# Patient Record
Sex: Male | Born: 1985 | Race: White | Hispanic: No | Marital: Single | Smoking: Current every day smoker
Health system: Southern US, Community
[De-identification: ages and names within clinical notes are randomized; demographics above are authoritative.]

## PROBLEM LIST (undated history)

## (undated) DIAGNOSIS — F32A Depression, unspecified: Secondary | ICD-10-CM

## (undated) DIAGNOSIS — F319 Bipolar disorder, unspecified: Secondary | ICD-10-CM

## (undated) DIAGNOSIS — G039 Meningitis, unspecified: Secondary | ICD-10-CM

## (undated) DIAGNOSIS — F209 Schizophrenia, unspecified: Secondary | ICD-10-CM

## (undated) DIAGNOSIS — F329 Major depressive disorder, single episode, unspecified: Secondary | ICD-10-CM

## (undated) DIAGNOSIS — F419 Anxiety disorder, unspecified: Secondary | ICD-10-CM

## (undated) DIAGNOSIS — F909 Attention-deficit hyperactivity disorder, unspecified type: Secondary | ICD-10-CM

---
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
## 2019-10-16 ENCOUNTER — Emergency Department (HOSPITAL_COMMUNITY): Payer: Self-pay

## 2019-10-16 ENCOUNTER — Inpatient Hospital Stay (HOSPITAL_COMMUNITY)
Admission: EM | Admit: 2019-10-16 | Discharge: 2019-10-20 | DRG: 580 | Disposition: A | Discharge status: Other Acute Inpatient Hospital | Payer: Self-pay | Attending: Surgery | Admitting: Surgery

## 2019-10-16 ENCOUNTER — Encounter (HOSPITAL_COMMUNITY): Payer: Self-pay | Admitting: Emergency Medicine

## 2019-10-16 DIAGNOSIS — F332 Major depressive disorder, recurrent severe without psychotic features: Secondary | ICD-10-CM

## 2019-10-16 DIAGNOSIS — S1191XA Laceration without foreign body of unspecified part of neck, initial encounter: Principal | ICD-10-CM | POA: Diagnosis present

## 2019-10-16 DIAGNOSIS — F1520 Other stimulant dependence, uncomplicated: Secondary | ICD-10-CM | POA: Diagnosis present

## 2019-10-16 DIAGNOSIS — F322 Major depressive disorder, single episode, severe without psychotic features: Secondary | ICD-10-CM | POA: Diagnosis present

## 2019-10-16 DIAGNOSIS — X789XXA Intentional self-harm by unspecified sharp object, initial encounter: Secondary | ICD-10-CM | POA: Diagnosis present

## 2019-10-16 DIAGNOSIS — F319 Bipolar disorder, unspecified: Secondary | ICD-10-CM | POA: Diagnosis present

## 2019-10-16 DIAGNOSIS — Z9114 Patient's other noncompliance with medication regimen: Secondary | ICD-10-CM

## 2019-10-16 DIAGNOSIS — Z9119 Patient's noncompliance with other medical treatment and regimen: Secondary | ICD-10-CM

## 2019-10-16 DIAGNOSIS — T1490XA Injury, unspecified, initial encounter: Secondary | ICD-10-CM

## 2019-10-16 DIAGNOSIS — Z23 Encounter for immunization: Secondary | ICD-10-CM

## 2019-10-16 DIAGNOSIS — R40242 Glasgow coma scale score 9-12, unspecified time: Secondary | ICD-10-CM | POA: Diagnosis present

## 2019-10-16 DIAGNOSIS — T1491XA Suicide attempt, initial encounter: Secondary | ICD-10-CM | POA: Diagnosis present

## 2019-10-16 DIAGNOSIS — S162XXA Laceration of muscle, fascia and tendon at neck level, initial encounter: Secondary | ICD-10-CM | POA: Diagnosis present

## 2019-10-16 DIAGNOSIS — R4182 Altered mental status, unspecified: Secondary | ICD-10-CM | POA: Diagnosis present

## 2019-10-16 DIAGNOSIS — Z818 Family history of other mental and behavioral disorders: Secondary | ICD-10-CM

## 2019-10-16 DIAGNOSIS — Z20822 Contact with and (suspected) exposure to covid-19: Secondary | ICD-10-CM | POA: Diagnosis present

## 2019-10-16 DIAGNOSIS — F1594 Other stimulant use, unspecified with stimulant-induced mood disorder: Secondary | ICD-10-CM

## 2019-10-16 DIAGNOSIS — N179 Acute kidney failure, unspecified: Secondary | ICD-10-CM | POA: Diagnosis present

## 2019-10-16 DIAGNOSIS — R61 Generalized hyperhidrosis: Secondary | ICD-10-CM | POA: Diagnosis present

## 2019-10-16 DIAGNOSIS — F209 Schizophrenia, unspecified: Secondary | ICD-10-CM | POA: Diagnosis present

## 2019-10-16 DIAGNOSIS — J9690 Respiratory failure, unspecified, unspecified whether with hypoxia or hypercapnia: Secondary | ICD-10-CM

## 2019-10-16 HISTORY — DX: Bipolar disorder, unspecified: F31.9

## 2019-10-16 HISTORY — DX: Schizophrenia, unspecified: F20.9

## 2019-10-16 LAB — URINALYSIS, ROUTINE W REFLEX MICROSCOPIC
Bilirubin Urine: NEGATIVE
Glucose, UA: 50 mg/dL — AB
Hgb urine dipstick: NEGATIVE
Ketones, ur: 20 mg/dL — AB
Leukocytes,Ua: NEGATIVE
Nitrite: NEGATIVE
Protein, ur: 30 mg/dL — AB
Specific Gravity, Urine: 1.032 — ABNORMAL HIGH (ref 1.005–1.030)
pH: 5 (ref 5.0–8.0)

## 2019-10-16 LAB — COMPREHENSIVE METABOLIC PANEL
ALT: 43 U/L (ref 0–44)
AST: 48 U/L — ABNORMAL HIGH (ref 15–41)
Albumin: 4.6 g/dL (ref 3.5–5.0)
Alkaline Phosphatase: 56 U/L (ref 38–126)
Anion gap: 23 — ABNORMAL HIGH (ref 5–15)
BUN: 20 mg/dL (ref 6–20)
CO2: 18 mmol/L — ABNORMAL LOW (ref 22–32)
Calcium: 9.6 mg/dL (ref 8.9–10.3)
Chloride: 98 mmol/L (ref 98–111)
Creatinine, Ser: 1.87 mg/dL — ABNORMAL HIGH (ref 0.61–1.24)
GFR calc Af Amer: 54 mL/min — ABNORMAL LOW (ref >60)
GFR calc non Af Amer: 47 mL/min — ABNORMAL LOW (ref >60)
Glucose, Bld: 250 mg/dL — ABNORMAL HIGH (ref 70–99)
Potassium: 3.2 mmol/L — ABNORMAL LOW (ref 3.5–5.1)
Sodium: 139 mmol/L (ref 135–145)
Total Bilirubin: 2.1 mg/dL — ABNORMAL HIGH (ref 0.3–1.2)
Total Protein: 7.2 g/dL (ref 6.5–8.1)

## 2019-10-16 LAB — CBC
HCT: 47.5 % (ref 39.0–52.0)
Hemoglobin: 15.8 g/dL (ref 13.0–17.0)
MCH: 29.8 pg (ref 26.0–34.0)
MCHC: 33.3 g/dL (ref 30.0–36.0)
MCV: 89.6 fL (ref 80.0–100.0)
Platelets: 265 10*3/uL (ref 150–400)
RBC: 5.3 MIL/uL (ref 4.22–5.81)
RDW: 12 % (ref 11.5–15.5)
WBC: 13 10*3/uL — ABNORMAL HIGH (ref 4.0–10.5)
nRBC: 0 % (ref 0.0–0.2)

## 2019-10-16 LAB — POCT I-STAT 7, (LYTES, BLD GAS, ICA,H+H)
Acid-Base Excess: 3 mmol/L — ABNORMAL HIGH (ref 0.0–2.0)
Bicarbonate: 28.9 mmol/L — ABNORMAL HIGH (ref 20.0–28.0)
Calcium, Ion: 1.26 mmol/L (ref 1.15–1.40)
HCT: 46 % (ref 39.0–52.0)
Hemoglobin: 15.6 g/dL (ref 13.0–17.0)
O2 Saturation: 100 %
Patient temperature: 97.5
Potassium: 3.6 mmol/L (ref 3.5–5.1)
Sodium: 135 mmol/L (ref 135–145)
TCO2: 30 mmol/L (ref 22–32)
pCO2 arterial: 46.3 mmHg (ref 32.0–48.0)
pH, Arterial: 7.4 (ref 7.350–7.450)
pO2, Arterial: 617 mmHg — ABNORMAL HIGH (ref 83.0–108.0)

## 2019-10-16 LAB — TRIGLYCERIDES: Triglycerides: 85 mg/dL (ref <150)

## 2019-10-16 LAB — PROTIME-INR
INR: 1.1 (ref 0.8–1.2)
Prothrombin Time: 13.6 seconds (ref 11.4–15.2)

## 2019-10-16 LAB — SAMPLE TO BLOOD BANK

## 2019-10-16 LAB — SARS CORONAVIRUS 2 BY RT PCR (HOSPITAL ORDER, PERFORMED IN ~~LOC~~ HOSPITAL LAB): SARS Coronavirus 2: NEGATIVE

## 2019-10-16 LAB — HIV ANTIBODY (ROUTINE TESTING W REFLEX): HIV Screen 4th Generation wRfx: NONREACTIVE

## 2019-10-16 LAB — I-STAT CHEM 8, ED
BUN: 23 mg/dL — ABNORMAL HIGH (ref 6–20)
Calcium, Ion: 1.11 mmol/L — ABNORMAL LOW (ref 1.15–1.40)
Chloride: 98 mmol/L (ref 98–111)
Creatinine, Ser: 1.4 mg/dL — ABNORMAL HIGH (ref 0.61–1.24)
Glucose, Bld: 230 mg/dL — ABNORMAL HIGH (ref 70–99)
HCT: 48 % (ref 39.0–52.0)
Hemoglobin: 16.3 g/dL (ref 13.0–17.0)
Potassium: 3.1 mmol/L — ABNORMAL LOW (ref 3.5–5.1)
Sodium: 137 mmol/L (ref 135–145)
TCO2: 21 mmol/L — ABNORMAL LOW (ref 22–32)

## 2019-10-16 LAB — LACTIC ACID, PLASMA: Lactic Acid, Venous: 10.5 mmol/L (ref 0.5–1.9)

## 2019-10-16 LAB — ETHANOL: Alcohol, Ethyl (B): <10 mg/dL (ref <10)

## 2019-10-16 LAB — MRSA PCR SCREENING: MRSA by PCR: NEGATIVE

## 2019-10-16 MED ORDER — LORAZEPAM 2 MG/ML IJ SOLN
INTRAMUSCULAR | Status: AC
  Filled 2019-10-16: qty 1

## 2019-10-16 MED ORDER — LORAZEPAM 2 MG/ML IJ SOLN
INTRAMUSCULAR | Status: AC | PRN
  Administered 2019-10-16: 1 mg via INTRAVENOUS

## 2019-10-16 MED ORDER — CHLORHEXIDINE GLUCONATE CLOTH 2 % EX PADS
6.0000 | MEDICATED_PAD | Freq: Every day | CUTANEOUS | Status: DC
  Administered 2019-10-16 – 2019-10-19 (×3): 6 via TOPICAL

## 2019-10-16 MED ORDER — LACTATED RINGERS IV SOLN: INTRAVENOUS | Status: DC

## 2019-10-16 MED ORDER — ORAL CARE MOUTH RINSE
15.0000 mL | OROMUCOSAL | Status: DC
  Administered 2019-10-16 – 2019-10-17 (×8): 15 mL via OROMUCOSAL

## 2019-10-16 MED ORDER — CHLORHEXIDINE GLUCONATE 0.12% ORAL RINSE (MEDLINE KIT)
15.0000 mL | Freq: Two times a day (BID) | OROMUCOSAL | Status: DC
  Administered 2019-10-16 – 2019-10-17 (×2): 15 mL via OROMUCOSAL

## 2019-10-16 MED ORDER — CEFAZOLIN SODIUM-DEXTROSE 2-4 GM/100ML-% IV SOLN
2.0000 g | Freq: Once | INTRAVENOUS | Status: AC
  Administered 2019-10-16: 2 g via INTRAVENOUS
  Filled 2019-10-16: qty 100

## 2019-10-16 MED ORDER — ONDANSETRON HCL 4 MG/2ML IJ SOLN: 4.0000 mg | Freq: Four times a day (QID) | INTRAMUSCULAR | Status: DC | PRN

## 2019-10-16 MED ORDER — PROPOFOL 1000 MG/100ML IV EMUL
5.0000 ug/kg/min | INTRAVENOUS | Status: DC
  Administered 2019-10-16: 40 ug/kg/min via INTRAVENOUS
  Administered 2019-10-16: 20 ug/kg/min via INTRAVENOUS
  Administered 2019-10-17: 65 ug/kg/min via INTRAVENOUS
  Administered 2019-10-17: 30 ug/kg/min via INTRAVENOUS
  Administered 2019-10-17: 50 ug/kg/min via INTRAVENOUS
  Filled 2019-10-16 (×5): qty 100

## 2019-10-16 MED ORDER — POLYETHYLENE GLYCOL 3350 17 G PO PACK
17.0000 g | PACK | Freq: Every day | ORAL | Status: DC
  Administered 2019-10-18: 17 g via ORAL
  Filled 2019-10-16 (×2): qty 1

## 2019-10-16 MED ORDER — ENOXAPARIN SODIUM 30 MG/0.3ML ~~LOC~~ SOLN
30.0000 mg | Freq: Two times a day (BID) | SUBCUTANEOUS | Status: DC
  Administered 2019-10-17 – 2019-10-19 (×5): 30 mg via SUBCUTANEOUS
  Filled 2019-10-16 (×7): qty 0.3

## 2019-10-16 MED ORDER — IOHEXOL 350 MG/ML SOLN
75.0000 mL | Freq: Once | INTRAVENOUS | Status: AC | PRN
  Administered 2019-10-16: 75 mL via INTRAVENOUS

## 2019-10-16 MED ORDER — FENTANYL CITRATE (PF) 100 MCG/2ML IJ SOLN: 50.0000 ug | Freq: Once | INTRAMUSCULAR | Status: DC

## 2019-10-16 MED ORDER — METOPROLOL TARTRATE 5 MG/5ML IV SOLN: 5.0000 mg | Freq: Four times a day (QID) | INTRAVENOUS | Status: DC | PRN

## 2019-10-16 MED ORDER — ROCURONIUM BROMIDE 50 MG/5ML IV SOLN
INTRAVENOUS | Status: AC | PRN
  Administered 2019-10-16: 100 mg via INTRAVENOUS

## 2019-10-16 MED ORDER — ONDANSETRON 4 MG PO TBDP: 4.0000 mg | ORAL_TABLET | Freq: Four times a day (QID) | ORAL | Status: DC | PRN

## 2019-10-16 MED ORDER — FENTANYL CITRATE (PF) 100 MCG/2ML IJ SOLN
INTRAMUSCULAR | Status: AC
  Filled 2019-10-16: qty 2

## 2019-10-16 MED ORDER — DOCUSATE SODIUM 50 MG/5ML PO LIQD
100.0000 mg | Freq: Two times a day (BID) | ORAL | Status: DC
  Administered 2019-10-16 – 2019-10-18 (×3): 100 mg
  Filled 2019-10-16 (×2): qty 10

## 2019-10-16 MED ORDER — FENTANYL CITRATE (PF) 100 MCG/2ML IJ SOLN
INTRAMUSCULAR | Status: AC | PRN
  Administered 2019-10-16: 100 ug via INTRAVENOUS

## 2019-10-16 MED ORDER — FENTANYL BOLUS VIA INFUSION
50.0000 ug | INTRAVENOUS | Status: DC | PRN
  Filled 2019-10-16: qty 50

## 2019-10-16 MED ORDER — FENTANYL 2500MCG IN NS 250ML (10MCG/ML) PREMIX INFUSION
50.0000 ug/h | INTRAVENOUS | Status: DC
  Administered 2019-10-16: 100 ug/h via INTRAVENOUS
  Administered 2019-10-16 – 2019-10-17 (×2): 200 ug/h via INTRAVENOUS
  Filled 2019-10-16 (×3): qty 250

## 2019-10-16 MED ORDER — PANTOPRAZOLE SODIUM 40 MG PO TBEC
40.0000 mg | DELAYED_RELEASE_TABLET | Freq: Every day | ORAL | Status: DC
  Administered 2019-10-18 – 2019-10-19 (×2): 40 mg via ORAL
  Filled 2019-10-16 (×2): qty 1

## 2019-10-16 MED ORDER — PANTOPRAZOLE SODIUM 40 MG IV SOLR
40.0000 mg | Freq: Every day | INTRAVENOUS | Status: DC
  Administered 2019-10-16 – 2019-10-17 (×2): 40 mg via INTRAVENOUS
  Filled 2019-10-16 (×2): qty 40

## 2019-10-16 MED ORDER — TETANUS-DIPHTH-ACELL PERTUSSIS 5-2.5-18.5 LF-MCG/0.5 IM SUSP
0.5000 mL | Freq: Once | INTRAMUSCULAR | Status: AC
  Administered 2019-10-16: 0.5 mL via INTRAMUSCULAR

## 2019-10-16 MED ORDER — ALBUMIN HUMAN 5 % IV SOLN
25.0000 g | Freq: Once | INTRAVENOUS | Status: AC
  Administered 2019-10-16: 25 g via INTRAVENOUS
  Filled 2019-10-16: qty 500

## 2019-10-16 MED ORDER — VECURONIUM BROMIDE 10 MG IV SOLR
INTRAVENOUS | Status: AC | PRN
  Administered 2019-10-16: 10 mg via INTRAVENOUS

## 2019-10-16 MED ORDER — DOCUSATE SODIUM 50 MG/5ML PO LIQD
100.0000 mg | Freq: Two times a day (BID) | ORAL | Status: DC
  Filled 2019-10-16 (×3): qty 10

## 2019-10-16 MED ORDER — ETOMIDATE 2 MG/ML IV SOLN
INTRAVENOUS | Status: AC | PRN
  Administered 2019-10-16: 20 mg via INTRAVENOUS

## 2019-10-16 NOTE — ED Notes (Signed)
TYLER HORNBECK girlfriend-- [phone]. Dr. Janee Morn spoke with her, she will be back- states that she hadn't heard from the patient in a couple days- no hx of schizophrenia except the paranoia when he is using.
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
## 2019-10-16 NOTE — Progress Notes (Signed)
Pt transported from ED to 4N without incident.
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
## 2019-10-16 NOTE — Progress Notes (Signed)
Orthopedic Tech Progress Note Patient Details:  Miguel Moore 05/28/1874 [id number] Level 1 Trauma Patient ID: Miguel Moore, male   DOB: 05/28/1874, 34 y.o.   MRN: [id number]   [name] 10/16/2019, 11:27 AM

## 2019-10-16 NOTE — ED Notes (Signed)
Pt was originally registered as Miguel Moore, Miguel Moore. Then name changed to Miguel Moore. Correct name is Miguel Moore.

## 2019-10-16 NOTE — Progress Notes (Signed)
Pt belongings:  pair gray socks  Cut jeans Brown belt $1.08 in change.

## 2019-10-16 NOTE — Progress Notes (Signed)
Patient ID: Miguel Moore, male   DOB: [birth year], 34 y.o.   MRN: [id number] I updated his GF at the bedside.  Violeta Gelinas, MD, MPH, FACS Please use [email or web address] to contact on call provider

## 2019-10-16 NOTE — ED Notes (Signed)
Dr Janee Morn at bedside- Time out- for emergency laceration repair
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
## 2019-10-17 ENCOUNTER — Inpatient Hospital Stay (HOSPITAL_COMMUNITY): Payer: Self-pay

## 2019-10-17 LAB — BASIC METABOLIC PANEL
Anion gap: 13 (ref 5–15)
BUN: 20 mg/dL (ref 6–20)
CO2: 25 mmol/L (ref 22–32)
Calcium: 9 mg/dL (ref 8.9–10.3)
Chloride: 102 mmol/L (ref 98–111)
Creatinine, Ser: 1.29 mg/dL — ABNORMAL HIGH (ref 0.61–1.24)
GFR calc Af Amer: >60 mL/min (ref >60)
GFR calc non Af Amer: >60 mL/min (ref >60)
Glucose, Bld: 72 mg/dL (ref 70–99)
Potassium: 3.7 mmol/L (ref 3.5–5.1)
Sodium: 140 mmol/L (ref 135–145)

## 2019-10-17 LAB — CBC
HCT: 39 % (ref 39.0–52.0)
Hemoglobin: 13.1 g/dL (ref 13.0–17.0)
MCH: 30 pg (ref 26.0–34.0)
MCHC: 33.6 g/dL (ref 30.0–36.0)
MCV: 89.4 fL (ref 80.0–100.0)
Platelets: 167 10*3/uL (ref 150–400)
RBC: 4.36 MIL/uL (ref 4.22–5.81)
RDW: 12.2 % (ref 11.5–15.5)
WBC: 10.1 10*3/uL (ref 4.0–10.5)
nRBC: 0 % (ref 0.0–0.2)

## 2019-10-17 LAB — LACTIC ACID, PLASMA: Lactic Acid, Venous: 1.7 mmol/L (ref 0.5–1.9)

## 2019-10-17 MED ORDER — BUPROPION HCL 75 MG PO TABS
75.0000 mg | ORAL_TABLET | Freq: Two times a day (BID) | ORAL | Status: DC
  Administered 2019-10-17 – 2019-10-18 (×2): 75 mg via ORAL
  Filled 2019-10-17 (×3): qty 1

## 2019-10-17 MED ORDER — LORAZEPAM 2 MG/ML IJ SOLN
0.0000 mg | INTRAMUSCULAR | Status: AC
  Administered 2019-10-17: 2 mg via INTRAVENOUS
  Filled 2019-10-17: qty 1

## 2019-10-17 MED ORDER — HALOPERIDOL LACTATE 5 MG/ML IJ SOLN: 5.0000 mg | Freq: Four times a day (QID) | INTRAMUSCULAR | Status: DC | PRN

## 2019-10-17 MED ORDER — FOLIC ACID 1 MG PO TABS
1.0000 mg | ORAL_TABLET | Freq: Every day | ORAL | Status: DC
  Administered 2019-10-18 – 2019-10-20 (×3): 1 mg via ORAL
  Filled 2019-10-17 (×3): qty 1

## 2019-10-17 MED ORDER — THIAMINE HCL 100 MG/ML IJ SOLN: 100.0000 mg | Freq: Every day | INTRAMUSCULAR | Status: DC

## 2019-10-17 MED ORDER — THIAMINE HCL 100 MG PO TABS
100.0000 mg | ORAL_TABLET | Freq: Every day | ORAL | Status: DC
  Administered 2019-10-18 – 2019-10-20 (×3): 100 mg via ORAL
  Filled 2019-10-17 (×3): qty 1

## 2019-10-17 MED ORDER — LORAZEPAM 2 MG/ML IJ SOLN: 1.0000 mg | INTRAMUSCULAR | Status: AC | PRN

## 2019-10-17 MED ORDER — LORAZEPAM 2 MG/ML IJ SOLN: 0.0000 mg | Freq: Three times a day (TID) | INTRAMUSCULAR | Status: DC

## 2019-10-17 MED ORDER — DEXMEDETOMIDINE HCL IN NACL 400 MCG/100ML IV SOLN
0.4000 ug/kg/h | INTRAVENOUS | Status: DC
  Administered 2019-10-17: 0.4 ug/kg/h via INTRAVENOUS
  Filled 2019-10-17: qty 100

## 2019-10-17 MED ORDER — LORAZEPAM 1 MG PO TABS
1.0000 mg | ORAL_TABLET | ORAL | Status: AC | PRN
  Administered 2019-10-19: 1 mg via ORAL
  Filled 2019-10-17: qty 1

## 2019-10-17 MED ORDER — ORAL CARE MOUTH RINSE
15.0000 mL | Freq: Two times a day (BID) | OROMUCOSAL | Status: DC
  Administered 2019-10-18 – 2019-10-20 (×5): 15 mL via OROMUCOSAL

## 2019-10-17 MED ORDER — ADULT MULTIVITAMIN W/MINERALS CH
1.0000 | ORAL_TABLET | Freq: Every day | ORAL | Status: DC
  Administered 2019-10-18 – 2019-10-20 (×3): 1 via ORAL
  Filled 2019-10-17 (×3): qty 1

## 2019-10-17 MED ORDER — LORAZEPAM 2 MG/ML IJ SOLN: 1.0000 mg | INTRAMUSCULAR | Status: DC | PRN

## 2019-10-17 MED ORDER — OXYCODONE HCL 5 MG PO TABS
5.0000 mg | ORAL_TABLET | ORAL | Status: DC | PRN
  Administered 2019-10-18 (×4): 5 mg via ORAL
  Filled 2019-10-17 (×4): qty 1

## 2019-10-17 MED ORDER — MORPHINE SULFATE (PF) 2 MG/ML IV SOLN: 1.0000 mg | INTRAVENOUS | Status: DC | PRN

## 2019-10-17 NOTE — Consult Note (Signed)
Patient unable to participate in psychiatric assessment because he is on ventilator. His participation in psychiatric evaluation is necessary in order to make a psychiatric treatment plan. Please re-consult psychiatric service after patient is extubated and able to participate in psychiatric assessment.  Thedore Mins, MD Attending psychiatrist.

## 2019-10-17 NOTE — Progress Notes (Signed)
Subjective/Chief Complaint: Sedated on vent. arousable   Objective: Vital signs in last 24 hours: Temp:  [95.8 F (35.4 C)-99.2 F (37.3 C)] 99.2 F (37.3 C) (05/22 0800) Pulse Rate:  [85-140] 85 (05/22 0700) Resp:  [18-28] 18 (05/22 0700) BP: (85-198)/(62-145) 103/69 (05/22 0700) SpO2:  [95 %-100 %] 100 % (05/22 0700) FiO2 (%):  [40 %-100 %] 40 % (05/22 0500) Weight:  [80 kg] 80 kg (05/21 1139) Last BM Date: (unknown)  Intake/Output from previous day: 05/21 0701 - 05/22 0700 In: 3703.2 [I.V.:3127.4; IV Piggyback:575.9] Out: 1250 [Urine:1250] Intake/Output this shift: No intake/output data recorded.  General appearance: sedated on vent Neck: laceration looks good Resp: clear to auscultation bilaterally and on vent Cardio: regular rate and rhythm GI: soft, non-tender; bowel sounds normal; no masses,  no organomegaly  Lab Results:  Recent Labs    10/16/19 1147 10/16/19 1147 10/16/19 1155 10/16/19 1355  WBC 13.0*  --   --   --   HGB 15.8   < > 16.3 15.6  HCT 47.5   < > 48.0 46.0  PLT 265  --   --   --    < > = values in this interval not displayed.   BMET Recent Labs    10/16/19 1147 10/16/19 1147 10/16/19 1155 10/16/19 1355  NA 139   < > 137 135  K 3.2*   < > 3.1* 3.6  CL 98  --  98  --   CO2 18*  --   --   --   GLUCOSE 250*  --  230*  --   BUN 20  --  23*  --   CREATININE 1.87*  --  1.40*  --   CALCIUM 9.6  --   --   --    < > = values in this interval not displayed.   PT/INR Recent Labs    10/16/19 1147  LABPROT 13.6  INR 1.1   ABG Recent Labs    10/16/19 1355  PHART 7.400  HCO3 28.9*    Studies/Results: CT Angio Neck W and/or Wo Contrast  Result Date: 10/16/2019 CLINICAL DATA:  Penetrating trauma of the neck. Self-inflicted laceration. EXAM: CT ANGIOGRAPHY NECK TECHNIQUE: Multidetector CT imaging of the neck was performed using the standard protocol during bolus administration of intravenous contrast. Multiplanar CT image  reconstructions and MIPs were obtained to evaluate the vascular anatomy. Carotid stenosis measurements (when applicable) are obtained utilizing NASCET criteria, using the distal internal carotid diameter as the denominator. CONTRAST:  24mL OMNIPAQUE IOHEXOL 350 MG/ML SOLN COMPARISON:  None. FINDINGS: Aortic arch: Normal Right carotid system: Normal Left carotid system: Normal Vertebral arteries: Normal Skeleton: Normal Other neck: Patient is intubated. There is air/gas superficially within the subcutaneous tissues on both sides of the neck consistent with the described laceration. No sign of major arterial or venous disruption. No sign of extravasated contrast. No radiopaque foreign object. Upper chest: Normal.  No pneumothorax. IMPRESSION: Apparently superficial laceration of the neck on both sides. No evidence of large or medium vessel injury or of significant soft tissue hematoma. Endotracheal tube in place. Electronically Signed   By: Nelson Chimes M.D.   On: 10/16/2019 12:03   DG Chest Port 1 View  Result Date: 10/17/2019 CLINICAL DATA:  Respiratory failure EXAM: PORTABLE CHEST 1 VIEW COMPARISON:  10/16/2019 FINDINGS: Cardiac shadow is within normal limits. Endotracheal tube and gastric catheter are noted. Lungs are clear bilaterally. No bony abnormality is noted. IMPRESSION: Tubes and lines as  described above. No acute abnormality noted. Electronically Signed   By: Alcide Clever M.D.   On: 10/17/2019 08:04   DG Chest Port 1 View  Result Date: 10/16/2019 CLINICAL DATA:  Hypoxia.  Neck injury EXAM: PORTABLE CHEST 1 VIEW COMPARISON:  None. FINDINGS: Endotracheal tube tip is 5.2 cm above carina. No pneumothorax. The lungs are clear. Heart size and pulmonary vascularity are normal. No adenopathy. No bone lesions. IMPRESSION: Endotracheal tube as described without pneumothorax. Lungs clear. Cardiac silhouette normal limits. Electronically Signed   By: Bretta Bang III M.D.   On: 10/16/2019 11:46     Anti-infectives: Anti-infectives (From admission, onward)   Start     Dose/Rate Route Frequency Ordered Stop   10/16/19 1445  ceFAZolin (ANCEF) IVPB 2g/100 mL premix     2 g 200 mL/hr over 30 Minutes Intravenous  Once 10/16/19 1432 10/16/19 1826      Assessment/Plan: s/p * No surgery found * wean vent but probably won't extubate today because of combativeness  SISW to the neck - will need psych consult after extubation Neck laceration - CTA negative for vascular injury. S/p repair in ED 5/21 Dr. Janee Morn. 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 Suicide precautions.  LOS: 1 day    Chevis Pretty III 10/17/2019

## 2019-10-17 NOTE — Procedures (Signed)
Extubation Procedure Note  Patient Details:   Name: Miguel Moore DOB: 1986-01-22 MRN: [id number]   Airway Documentation:    Vent end date: 10/17/19 Vent end time: 1545   Evaluation  O2 sats: stable throughout Complications: No apparent complications Patient did tolerate procedure well. Bilateral Breath Sounds: Clear   Yes   Patient extubated to [place]. Vital signs stable throughout. No complications. Family and RN at bedside. RT will continue to monitor.  Ave Filter 10/17/2019, 3:48 PM

## 2019-10-18 DIAGNOSIS — F1594 Other stimulant use, unspecified with stimulant-induced mood disorder: Secondary | ICD-10-CM

## 2019-10-18 DIAGNOSIS — F332 Major depressive disorder, recurrent severe without psychotic features: Secondary | ICD-10-CM

## 2019-10-18 DIAGNOSIS — T1491XA Suicide attempt, initial encounter: Secondary | ICD-10-CM

## 2019-10-18 LAB — CBC
HCT: 32 % — ABNORMAL LOW (ref 39.0–52.0)
Hemoglobin: 11.1 g/dL — ABNORMAL LOW (ref 13.0–17.0)
MCH: 30.4 pg (ref 26.0–34.0)
MCHC: 34.7 g/dL (ref 30.0–36.0)
MCV: 87.7 fL (ref 80.0–100.0)
Platelets: 152 10*3/uL (ref 150–400)
RBC: 3.65 MIL/uL — ABNORMAL LOW (ref 4.22–5.81)
RDW: 11.6 % (ref 11.5–15.5)
WBC: 10.4 10*3/uL (ref 4.0–10.5)
nRBC: 0 % (ref 0.0–0.2)

## 2019-10-18 LAB — BASIC METABOLIC PANEL
Anion gap: 8 (ref 5–15)
BUN: 15 mg/dL (ref 6–20)
CO2: 22 mmol/L (ref 22–32)
Calcium: 8.2 mg/dL — ABNORMAL LOW (ref 8.9–10.3)
Chloride: 102 mmol/L (ref 98–111)
Creatinine, Ser: 1.01 mg/dL (ref 0.61–1.24)
GFR calc Af Amer: >60 mL/min (ref >60)
GFR calc non Af Amer: >60 mL/min (ref >60)
Glucose, Bld: 93 mg/dL (ref 70–99)
Potassium: 3.2 mmol/L — ABNORMAL LOW (ref 3.5–5.1)
Sodium: 132 mmol/L — ABNORMAL LOW (ref 135–145)

## 2019-10-18 MED ORDER — BUPROPION HCL ER (XL) 150 MG PO TB24
150.0000 mg | ORAL_TABLET | Freq: Every day | ORAL | Status: DC
  Administered 2019-10-19 – 2019-10-20 (×2): 150 mg via ORAL
  Filled 2019-10-18 (×2): qty 1

## 2019-10-18 NOTE — Progress Notes (Signed)
Subjective/Chief Complaint: Complains of neck soreness   Objective: Vital signs in last 24 hours: Temp:  [98.7 F (37.1 C)-100.9 F (38.3 C)] 100.9 F (38.3 C) (05/23 0802) Pulse Rate:  [73-128] 90 (05/23 0800) Resp:  [11-30] 18 (05/23 0800) BP: (94-149)/(59-102) 134/75 (05/23 0800) SpO2:  [90 %-100 %] 91 % (05/23 0800) FiO2 (%):  [40 %] 40 % (05/22 1522) Last BM Date: (unknown)  Intake/Output from previous day: 05/22 0701 - 05/23 0700 In: 5921.9 [P.O.:3250; I.V.:2671.9] Out: 1150 [Urine:1150] Intake/Output this shift: Total I/O In: 75 [I.V.:75] Out: -   General appearance: alert and cooperative Resp: clear to auscultation bilaterally Cardio: regular rate and rhythm GI: soft, nontender  Lab Results:  Recent Labs    10/17/19 1123 10/18/19 0227  WBC 10.1 10.4  HGB 13.1 11.1*  HCT 39.0 32.0*  PLT 167 152   BMET Recent Labs    10/17/19 1123 10/18/19 0227  NA 140 132*  K 3.7 3.2*  CL 102 102  CO2 25 22  GLUCOSE 72 93  BUN 20 15  CREATININE 1.29* 1.01  CALCIUM 9.0 8.2*   PT/INR Recent Labs    10/16/19 1147  LABPROT 13.6  INR 1.1   ABG Recent Labs    10/16/19 1355  PHART 7.400  HCO3 28.9*    Studies/Results: CT Angio Neck W and/or Wo Contrast  Result Date: 10/16/2019 CLINICAL DATA:  Penetrating trauma of the neck. Self-inflicted laceration. EXAM: CT ANGIOGRAPHY NECK TECHNIQUE: Multidetector CT imaging of the neck was performed using the standard protocol during bolus administration of intravenous contrast. Multiplanar CT image reconstructions and MIPs were obtained to evaluate the vascular anatomy. Carotid stenosis measurements (when applicable) are obtained utilizing NASCET criteria, using the distal internal carotid diameter as the denominator. CONTRAST:  39mL OMNIPAQUE IOHEXOL 350 MG/ML SOLN COMPARISON:  None. FINDINGS: Aortic arch: Normal Right carotid system: Normal Left carotid system: Normal Vertebral arteries: Normal Skeleton: Normal  Other neck: Patient is intubated. There is air/gas superficially within the subcutaneous tissues on both sides of the neck consistent with the described laceration. No sign of major arterial or venous disruption. No sign of extravasated contrast. No radiopaque foreign object. Upper chest: Normal.  No pneumothorax. IMPRESSION: Apparently superficial laceration of the neck on both sides. No evidence of large or medium vessel injury or of significant soft tissue hematoma. Endotracheal tube in place. Electronically Signed   By: Nelson Chimes M.D.   On: 10/16/2019 12:03   DG Chest Port 1 View  Result Date: 10/17/2019 CLINICAL DATA:  Respiratory failure EXAM: PORTABLE CHEST 1 VIEW COMPARISON:  10/16/2019 FINDINGS: Cardiac shadow is within normal limits. Endotracheal tube and gastric catheter are noted. Lungs are clear bilaterally. No bony abnormality is noted. IMPRESSION: Tubes and lines as described above. No acute abnormality noted. Electronically Signed   By: Inez Catalina M.D.   On: 10/17/2019 08:04   DG Chest Port 1 View  Result Date: 10/16/2019 CLINICAL DATA:  Hypoxia.  Neck injury EXAM: PORTABLE CHEST 1 VIEW COMPARISON:  None. FINDINGS: Endotracheal tube tip is 5.2 cm above carina. No pneumothorax. The lungs are clear. Heart size and pulmonary vascularity are normal. No adenopathy. No bone lesions. IMPRESSION: Endotracheal tube as described without pneumothorax. Lungs clear. Cardiac silhouette normal limits. Electronically Signed   By: Lowella Grip III M.D.   On: 10/16/2019 11:46    Anti-infectives: Anti-infectives (From admission, onward)   Start     Dose/Rate Route Frequency Ordered Stop   10/16/19 1445  ceFAZolin (  ANCEF) IVPB 2g/100 mL premix     2 g 200 mL/hr over 30 Minutes Intravenous  Once 10/16/19 1432 10/16/19 1826      Assessment/Plan: s/p * No surgery found * Advance diet  Will need psych eval Continue suicide precautions SISWto theneck - will need psych consultafter  extubation Neck laceration - CTA negative for vascular injury. S/p repair in ED 5/21 Dr. Janee Morn. 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  LOS: 2 days    Miguel Moore III 10/18/2019

## 2019-10-18 NOTE — Consult Note (Addendum)
Telepsych Consultation   Reason for Consult: ''laceration of neck muscle-suicide attempt.'' Referring Physician:  Emelia Loron, MD Location of Patient: [facility] Location of Provider: [facility]  Patient Identification: Haleem Hanner MRN:  [id number] Principal Diagnosis: Suicidal behavior with attempted self-injury [facility]) Diagnosis:  Principal Problem:   Suicidal behavior with attempted self-injury [facility]) Active Problems:   Laceration of muscle of neck   Methamphetamine-induced mood disorder (HCC)   Recurrent seasonal major depression, severe (HCC)   Total Time spent with patient: 1 hour  Subjective:   Keynan Heffern is a 34 y.o. male patient admitted due to self inflicted injury to the neck.  HPI: Patient who reports prior history of ADHD-diagnosed in 8 th grade, recently diagnosed with depression, placed on Wellbutrin but non-compliant with the medication but was self medicating with Methamphetamine, alcohol and other illicit prior admission to the hospital. Patient is a poor historian, says he cannot recollect the sequence of events that lead to current hospitalization but gave permission to talk to his girlfriend with whom he lives and daughter in [place], [place]. Patient He has been non-compliant with his Wellbutrin. He has been self-medicating with meth and his GF has not seen him in 3 days. Per chart, patient was brought to the ED after he was found in the wood running from Patent examiner. Patient reports that he was using meth and attempted suicide by a self inflicted stab wound to the neck, he states that he did not remember what happened thereafter. He reports using meth and other illicit substance off and on for the past 10 years but says meth is his drug of choice.  Patient reports history of Detox/Rehabs 2x in the past, last rehab was in 2020 and was sober for 100 days before relapsing. Patient's girlfriend want his discharged home to see his daughter but patient  still unable to contract for safety. He remains depressed, hopeless, irritable but denies psychosis or delusions.  Past Psychiatric History: as above  Risk to Self:  patient attempted suicide by self inflicted injury Risk to Others:  denies Prior Inpatient Therapy:  denies Prior Outpatient Therapy:  PCP  Past Medical History:  Past Medical History:  Diagnosis Date  . Bipolar 1 disorder (HCC)   . Schizophrenia (HCC)    History reviewed. No pertinent surgical history. Family History: No family history on file. Family Psychiatric  History: Depression, Anxiety,alcohol abuse runs on both sides of his family Social History:  Social History   Substance and Sexual Activity  Alcohol Use None     Social History   Substance and Sexual Activity  Drug Use Not on file    Social History   Socioeconomic History  . Marital status: Unknown    Spouse name: Not on file  . Number of children: Not on file  . Years of education: Not on file  . Highest education level: Not on file  Occupational History  . Not on file  Tobacco Use  . Smoking status: Not on file  Substance and Sexual Activity  . Alcohol use: Not on file  . Drug use: Not on file  . Sexual activity: Not on file  Other Topics Concern  . Not on file  Social History Narrative  . Not on file   Social Determinants of Health   Financial Resource Strain:   . Difficulty of Paying Living Expenses:   Food Insecurity:   . Worried About Programme researcher, broadcasting/film/video in the Last Year:   . [employer] of  Food in the Last Year:   Transportation Needs:   . Freight forwarder (Medical):   Marland Kitchen Lack of Transportation (Non-Medical):   Physical Activity:   . Days of Exercise per Week:   . Minutes of Exercise per Session:   Stress:   . Feeling of Stress :   Social Connections:   . Frequency of Communication with Friends and Family:   . Frequency of Social Gatherings with Friends and Family:   . Attends Religious Services:   . Active Member of  Clubs or Organizations:   . Attends Banker Meetings:   Marland Kitchen Marital Status:    Additional Social History:    Allergies:  No Known Allergies  Labs:  Results for orders placed or performed during the hospital encounter of 10/16/19 (from the past 48 hour(s))  I-STAT 7, (LYTES, BLD GAS, ICA, H+H)     Status: Abnormal   Collection Time: 10/16/19  1:55 PM  Result Value Ref Range   pH, Arterial 7.400 7.350 - 7.450   pCO2 arterial 46.3 32.0 - 48.0 mmHg   pO2, Arterial 617 (H) 83.0 - 108.0 mmHg   Bicarbonate 28.9 (H) 20.0 - 28.0 mmol/L   TCO2 30 22 - 32 mmol/L   O2 Saturation 100.0 %   Acid-Base Excess 3.0 (H) 0.0 - 2.0 mmol/L   Sodium 135 135 - 145 mmol/L   Potassium 3.6 3.5 - 5.1 mmol/L   Calcium, Ion 1.26 1.15 - 1.40 mmol/L   HCT 46.0 39.0 - 52.0 %   Hemoglobin 15.6 13.0 - 17.0 g/dL   Patient temperature 36.6 F    Collection site Radial    Drawn by RT    Sample type ARTERIAL   MRSA PCR Screening     Status: None   Collection Time: 10/16/19  1:59 PM   Specimen: Nasal Mucosa; Nasopharyngeal  Result Value Ref Range   MRSA by PCR NEGATIVE NEGATIVE    Comment:        The GeneXpert MRSA Assay (FDA approved for NASAL specimens only), is one component of a comprehensive MRSA colonization surveillance program. It is not intended to diagnose MRSA infection nor to guide or monitor treatment for MRSA infections. Performed at [facility], [address] [place], [place] [address]   Urinalysis, Routine w reflex microscopic     Status: Abnormal   Collection Time: 10/16/19  3:44 PM  Result Value Ref Range   Color, Urine AMBER (A) YELLOW    Comment: BIOCHEMICALS MAY BE AFFECTED BY COLOR   APPearance CLEAR CLEAR   Specific Gravity, Urine 1.032 (H) 1.005 - 1.030   pH 5.0 5.0 - 8.0   Glucose, UA 50 (A) NEGATIVE mg/dL   Hgb urine dipstick NEGATIVE NEGATIVE   Bilirubin Urine NEGATIVE NEGATIVE   Ketones, ur 20 (A) NEGATIVE mg/dL   Protein, ur 30 (A) NEGATIVE mg/dL    Nitrite NEGATIVE NEGATIVE   Leukocytes,Ua NEGATIVE NEGATIVE   RBC / HPF 0-5 0 - 5 RBC/hpf   WBC, UA 0-5 0 - 5 WBC/hpf   Bacteria, UA RARE (A) NONE SEEN   Mucus PRESENT    Hyaline Casts, UA PRESENT     Comment: Performed at [facility], [address], [place], [place] [address]  Basic metabolic panel     Status: Abnormal   Collection Time: 10/17/19 11:23 AM  Result Value Ref Range   Sodium 140 135 - 145 mmol/L   Potassium 3.7 3.5 - 5.1 mmol/L  Chloride 102 98 - 111 mmol/L   CO2 25 22 - 32 mmol/L   Glucose, Bld 72 70 - 99 mg/dL    Comment: Glucose reference range applies only to samples taken after fasting for at least 8 hours.   BUN 20 6 - 20 mg/dL   Creatinine, Ser 8.461.29 (H) 0.61 - 1.24 mg/dL   Calcium 9.0 8.9 - 96.210.3 mg/dL   GFR calc non Af Amer >60 >60 mL/min   GFR calc Af Amer >60 >60 mL/min   Anion gap 13 5 - 15    Comment: Performed at [facility], [address] [place], [place] [address]  CBC     Status: None   Collection Time: 10/17/19 11:23 AM  Result Value Ref Range   WBC 10.1 4.0 - 10.5 K/uL   RBC 4.36 4.22 - 5.81 MIL/uL   Hemoglobin 13.1 13.0 - 17.0 g/dL   HCT 13.239.0 44.039.0 - 10.252.0 %   MCV 89.4 80.0 - 100.0 fL   MCH 30.0 26.0 - 34.0 pg   MCHC 33.6 30.0 - 36.0 g/dL   RDW 72.512.2 36.611.5 - 44.015.5 %   Platelets 167 150 - 400 K/uL   nRBC 0.0 0.0 - 0.2 %    Comment: Performed at [facility], [address] [place], [place] [address]  Lactic acid, plasma     Status: None   Collection Time: 10/17/19 11:23 AM  Result Value Ref Range   Lactic Acid, Venous 1.7 0.5 - 1.9 mmol/L    Comment: Performed at [facility], [address] [place], [place] [address]  Basic metabolic panel     Status: Abnormal   Collection Time: 10/18/19  2:27 AM  Result Value Ref Range   Sodium 132 (L) 135 - 145 mmol/L   Potassium 3.2 (L) 3.5 - 5.1 mmol/L   Chloride 102 98 - 111 mmol/L   CO2 22 22 - 32 mmol/L   Glucose, Bld 93 70 - 99 mg/dL    Comment: Glucose reference  range applies only to samples taken after fasting for at least 8 hours.   BUN 15 6 - 20 mg/dL   Creatinine, Ser 8.751.01 0.61 - 1.24 mg/dL   Calcium 8.2 (L) 8.9 - 10.3 mg/dL   GFR calc non Af Amer >60 >60 mL/min   GFR calc Af Amer >60 >60 mL/min   Anion gap 8 5 - 15    Comment: Performed at [facility], [address] [place], [place] [address]  CBC     Status: Abnormal   Collection Time: 10/18/19  2:27 AM  Result Value Ref Range   WBC 10.4 4.0 - 10.5 K/uL   RBC 3.65 (L) 4.22 - 5.81 MIL/uL   Hemoglobin 11.1 (L) 13.0 - 17.0 g/dL   HCT 95.132.0 (L) 88.439.0 - 16.652.0 %   MCV 87.7 80.0 - 100.0 fL   MCH 30.4 26.0 - 34.0 pg   MCHC 34.7 30.0 - 36.0 g/dL   RDW 06.311.6 01.611.5 - 01.015.5 %   Platelets 152 150 - 400 K/uL   nRBC 0.0 0.0 - 0.2 %    Comment: Performed at [facility], [address] [place], [place] [address]    Medications:  Current Facility-Administered Medications  Medication Dose Route Frequency Provider Last Rate Last Admin  . [START ON 10/19/2019] buPROPion (WELLBUTRIN XL) 24 hr tablet 150 mg  150 mg Oral Daily Thedore MinsAkintayo, Jesseka Drinkard, MD      .  Chlorhexidine Gluconate Cloth 2 % PADS 6 each  6 each Topical Daily Violeta Gelinas, MD   6 each at 10/16/19 1409  . dexmedetomidine (PRECEDEX) 400 MCG/100ML (4 mcg/mL) infusion  0.4-1.2 mcg/kg/hr Intravenous Titrated Emelia Loron, MD   Stopped at 10/17/19 1733  . docusate (COLACE) 50 MG/5ML liquid 100 mg  100 mg Per Tube BID Violeta Gelinas, MD   100 mg at 10/18/19 0824  . enoxaparin (LOVENOX) injection 30 mg  30 mg Subcutaneous Q12H Violeta Gelinas, MD   30 mg at 10/18/19 1000  . folic acid (FOLVITE) tablet 1 mg  1 mg Oral Daily Emelia Loron, MD   1 mg at 10/18/19 6546  . haloperidol lactate (HALDOL) injection 5 mg  5 mg Intravenous Q6H PRN Emelia Loron, MD      . lactated ringers infusion   Intravenous Continuous Emelia Loron, MD 75 mL/hr at 10/18/19 1200 Rate Verify at 10/18/19 1200  . LORazepam (ATIVAN) injection 0-4  mg  0-4 mg Intravenous Q4H Emelia Loron, MD   2 mg at 10/17/19 1945   Followed by  . [START ON 10/19/2019] LORazepam (ATIVAN) injection 0-4 mg  0-4 mg Intravenous Veryl Speak, MD      . LORazepam (ATIVAN) tablet 1-4 mg  1-4 mg Oral Q1H PRN Emelia Loron, MD       Or  . LORazepam (ATIVAN) injection 1-4 mg  1-4 mg Intravenous Q1H PRN Emelia Loron, MD      . MEDLINE mouth rinse  15 mL Mouth Rinse BID Emelia Loron, MD   15 mL at 10/18/19 0825  . metoprolol tartrate (LOPRESSOR) injection 5 mg  5 mg Intravenous Q6H PRN Violeta Gelinas, MD      . morphine 2 MG/ML injection 1-2 mg  1-2 mg Intravenous Q2H PRN Emelia Loron, MD      . multivitamin with minerals tablet 1 tablet  1 tablet Oral Daily Emelia Loron, MD   1 tablet at 10/18/19 [phone]  . ondansetron (ZOFRAN-ODT) disintegrating tablet 4 mg  4 mg Oral Q6H PRN Violeta Gelinas, MD       Or  . ondansetron [facility]) injection 4 mg  4 mg Intravenous Q6H PRN Violeta Gelinas, MD      . oxyCODONE (Oxy IR/ROXICODONE) immediate release tablet 5 mg  5 mg Oral Q4H PRN Emelia Loron, MD   5 mg at 10/18/19 4656  . pantoprazole (PROTONIX) EC tablet 40 mg  40 mg Oral Daily Violeta Gelinas, MD   40 mg at 10/18/19 8127   Or  . pantoprazole (PROTONIX) injection 40 mg  40 mg Intravenous Daily Violeta Gelinas, MD   40 mg at 10/17/19 1043  . polyethylene glycol (MIRALAX / GLYCOLAX) packet 17 g  17 g Oral Daily Violeta Gelinas, MD   17 g at 10/18/19 5170  . thiamine tablet 100 mg  100 mg Oral Daily Emelia Loron, MD   100 mg at 10/18/19 0174   Or  . thiamine (B-1) injection 100 mg  100 mg Intravenous Daily Emelia Loron, MD        Musculoskeletal: Strength & Muscle Tone: not assessed, pt seen via telemed Gait & Station: not assessed, pt seen via telemed Patient leans: N/A  Psychiatric Specialty Exam: Physical Exam  Psychiatric: His speech is normal. He is withdrawn. Cognition and memory are normal. He  expresses impulsivity. He exhibits a depressed mood. He expresses suicidal ideation. He expresses suicidal plans.    Review of Systems  Constitutional: Negative.   HENT: Negative.  Eyes: Negative.   Respiratory: Negative.   Cardiovascular: Negative.   Gastrointestinal: Negative.   Endocrine: Negative.   Genitourinary: Negative.   Musculoskeletal: Negative.   Allergic/Immunologic: Negative.   Neurological: Negative.   Psychiatric/Behavioral: Positive for dysphoric mood, self-injury and suicidal ideas.    Blood pressure (!) 145/79, pulse 94, temperature (!) 100.9 F (38.3 C), resp. rate 10, height 6' (1.829 m), weight 80 kg, SpO2 91 %.Body mass index is 23.92 kg/m.  General Appearance: Casual  Eye Contact:  Good  Speech:  Clear and Coherent  Volume:  Increased  Mood:  Irritable  Affect:  Congruent  Thought Process:  Coherent  Orientation:  Full (Time, Place, and Person)  Thought Content:  Logical  Suicidal Thoughts:  Yes.  with intent/plan  Homicidal Thoughts:  No  Memory:  Immediate;   Good Recent;   Good Remote;   Good  Judgement:  Poor  Insight:  Lacking  Psychomotor Activity:  Increased  Concentration:  Concentration: Fair and Attention Span: Fair  Recall:  [employer] of Knowledge:  Good  Language:  Good  Akathisia:  No  Handed:  Right  AIMS (if indicated):     Assets:  Communication Skills Desire for Improvement  ADL's:  Intact  Cognition:  WNL  Sleep:        Treatment Plan Summary: 34 year old male with history of ADHD, Depression and polysubstance dependence who was admitted after he attempted suicide with drug use and self inflicted stab wound to the neck. Patient is still depressed, irritable and unable to contract for safety. He will benefit from referral to the inpatient psychiatric facility for stabilization after he is medically stable.  Recommendations: -Discontinue Wellbutrin 75 mg bid-may increase agitation. -Consider Wellbutrin XL 150 mg daily  for depression. -Continue Haldol 5 mg q6h as needed for agitation -Consider social worker consult to facilitate inpatient psychiatric facility placement -Consider IVC if patient refused Voluntary inpatient psychiatric admission.  Disposition: Recommend psychiatric Inpatient admission when medically cleared. Supportive therapy provided about ongoing stressors. Psychiatric service siging off. Re-consult as needed  This service was provided via telemedicine using a 2-way, interactive audio and video technology.  Names of all persons participating in this telemedicine service and their role in this encounter. Name: Terrence Dupont Role: Patient  Name: Luberta Mutter Role: Girlfriend  Name: Donnita Falls Role: RN  Name: Corena Pilgrim, MD Role: Psychiatrist    Corena Pilgrim, MD 10/18/2019 1:04 PM

## 2019-10-19 LAB — BASIC METABOLIC PANEL
Anion gap: 9 (ref 5–15)
BUN: 9 mg/dL (ref 6–20)
CO2: 29 mmol/L (ref 22–32)
Calcium: 8.7 mg/dL — ABNORMAL LOW (ref 8.9–10.3)
Chloride: 100 mmol/L (ref 98–111)
Creatinine, Ser: 0.93 mg/dL (ref 0.61–1.24)
GFR calc Af Amer: >60 mL/min (ref >60)
GFR calc non Af Amer: >60 mL/min (ref >60)
Glucose, Bld: 105 mg/dL — ABNORMAL HIGH (ref 70–99)
Potassium: 3.7 mmol/L (ref 3.5–5.1)
Sodium: 138 mmol/L (ref 135–145)

## 2019-10-19 LAB — CBC
HCT: 30.5 % — ABNORMAL LOW (ref 39.0–52.0)
Hemoglobin: 10.5 g/dL — ABNORMAL LOW (ref 13.0–17.0)
MCH: 30.3 pg (ref 26.0–34.0)
MCHC: 34.4 g/dL (ref 30.0–36.0)
MCV: 87.9 fL (ref 80.0–100.0)
Platelets: 160 10*3/uL (ref 150–400)
RBC: 3.47 MIL/uL — ABNORMAL LOW (ref 4.22–5.81)
RDW: 11.8 % (ref 11.5–15.5)
WBC: 7 10*3/uL (ref 4.0–10.5)
nRBC: 0 % (ref 0.0–0.2)

## 2019-10-19 NOTE — TOC Initial Note (Signed)
Transition of Care [facility]) - Initial/Assessment Note    Patient Details  Name: Miguel Moore MRN: [id number] Date of Birth: 1986-02-11  Transition [facility]) CM/SW Contact:    Glennon Mac, RN Phone Number: 10/19/2019, 3:14 PM  Clinical Narrative:  Pt admitted on 10/16/19 after slitting his own throat as a suicide attempt after using meth.  Per psych MD, pt requires inpatient psychiatric admission.  Pt wanting to go home, per bedside nurse; will plan involuntary commitment as fear pt may go AMA.  IVC paperwork completed and placed on chart; [employer] has been notified to serve IVC papers.  Both [facility] and [facility] are reviewing patient for possible admission, as he is medically stable.                    Expected Discharge Plan: Psychiatric Hospital Barriers to Discharge: Continued Medical Work up           Expected Discharge Plan and Services Expected Discharge Plan: Psychiatric Hospital   Discharge Planning Services: CM Consult   Living arrangements for the past 2 months: Single Family Home                                      Prior Living Arrangements/Services Living arrangements for the past 2 months: Single Family Home Lives with:: Significant Other Patient language and need for interpreter reviewed:: Yes Do you feel safe going back to the place where you live?: Yes      Need for Family Participation in Patient Care: Yes (Comment) Care giver support system in place?: Yes (comment)   Criminal Activity/Legal Involvement Pertinent to Current Situation/Hospitalization: No - Comment as needed                 Emotional Assessment Appearance:: Appears stated age   Affect (typically observed): Appropriate Orientation: : Oriented to Self, Oriented to Place, Oriented to  Time, Oriented to Situation Alcohol / Substance Use: Illicit Drugs    Admission diagnosis:  Suicide attempt (HCC) [T14.91XA] Trauma [T14.90XA] Laceration of neck, initial  encounter [S11.91XA] Laceration of muscle of neck [S16.2XXA] Patient Active Problem List   Diagnosis Date Noted  . Methamphetamine-induced mood disorder (HCC) 10/18/2019  . Suicidal behavior with attempted self-injury (HCC) 10/18/2019  . Recurrent seasonal major depression, severe (HCC) 10/18/2019  . Laceration of muscle of neck 10/16/2019   PCP:  Patient, No Pcp Per Pharmacy:   [id number] [place], [place] - [address] [place] [place] [address] Phone: [phone] Fax: [phone]     Social Determinants of Health (SDOH) Interventions    Readmission Risk Interventions No flowsheet data found.  Quintella Baton, RN, BSN  Trauma/Neuro ICU Case Manager [phone]

## 2019-10-19 NOTE — Progress Notes (Signed)
   Subjective/Chief Complaint: No complaints    Objective: Vital signs in last 24 hours: Temp:  [98.1 F (36.7 C)-99.4 F (37.4 C)] 98.3 F (36.8 C) (05/24 0754) Pulse Rate:  [75-97] 75 (05/24 0800) Resp:  [10-19] 13 (05/24 0800) BP: (113-145)/(63-90) 126/82 (05/24 0800) SpO2:  [87 %-98 %] 91 % (05/24 0800) Last BM Date: (unknown)  Intake/Output from previous day: 05/23 0701 - 05/24 0700 In: 1648 [I.V.:1648] Out: 1200 [Urine:1200] Intake/Output this shift: No intake/output data recorded.  Exam: Awake and alert Neck incision stable Lungs clear CV RRR  Lab Results:  Recent Labs    10/18/19 0227 10/19/19 0734  WBC 10.4 7.0  HGB 11.1* 10.5*  HCT 32.0* 30.5*  PLT 152 160   BMET Recent Labs    10/17/19 1123 10/18/19 0227  NA 140 132*  K 3.7 3.2*  CL 102 102  CO2 25 22  GLUCOSE 72 93  BUN 20 15  CREATININE 1.29* 1.01  CALCIUM 9.0 8.2*   PT/INR Recent Labs    10/16/19 1147  LABPROT 13.6  INR 1.1   ABG Recent Labs    10/16/19 1355  PHART 7.400  HCO3 28.9*    Studies/Results: No results found.  Anti-infectives: Anti-infectives (From admission, onward)   Start     Dose/Rate Route Frequency Ordered Stop   10/16/19 1445  ceFAZolin (ANCEF) IVPB 2g/100 mL premix     2 g 200 mL/hr over 30 Minutes Intravenous  Once 10/16/19 1432 10/16/19 1826      Assessment/Plan: Advance diet    Continue suicide precautions SISWto theneck - will need psych consultafter extubation Neck laceration - CTA negative for vascular injury. S/p repair in ED 5/21 Dr. Janee Morn.  Psychiatric illness, ?Bipolar/schizophrenia, depression - psych consult yesterday.  Recommends inpt admission at psych facility when medically ready PSA - meth use prior to admission, will need SW consult for SBIRT  Doing well Psych meds started by psychiatry yesterday Will transfer to floor OK from a medical/surgical standpoint for discharge/admission to inpt psych Will have them  notified  LOS: 3 days    Abigail Miyamoto 10/19/2019

## 2019-10-19 NOTE — TOC CAGE-AID Note (Addendum)
Transition of Care [facility]) - CAGE-AID Screening   Patient Details  Name: Miguel Moore MRN: [id number] Date of Birth: 1986/04/17  Transition of [facility]) CM/SW Contact:    Emeterio Reeve, [place] Phone Number: 10/19/2019, 10:47 AM   Clinical Narrative:  CSW met with pt bedside. Pt stated that he uses alcohol almost daily. Pt stated that he has 8-12 beers a day, some days he has liquor but not daily. Pt reports substance use. Pt states that he uses whatever drugs he has or what he can find. Pt stated he has been to treatment in the past and had 100 days clean at one time. Pt stated he doesn't know what made him return to substance use. Pt stated that he does have a problem with drugs and alcohol and he is interested in going to treatment. Pt was receptive to resources.   CAGE-AID Screening:    Have You Ever Felt You Ought to Cut Down on Your Drinking or Drug Use?: Yes Have People Annoyed You By Critizing Your Drinking Or Drug Use?: Yes Have You Felt Bad Or Guilty About Your Drinking Or Drug Use?: No Have You Ever Had a Drink or Used Drugs First Thing In The Morning to STeady Your Nerves or to Get Rid of a Hangover?: Yes CAGE-AID Score: 3  Substance Abuse Education Offered: Yes  Substance abuse interventions: Scientist, clinical (histocompatibility and immunogenetics), Patient Counseling      Emeterio Reeve, Latanya Presser, [place] Social Worker [phone]

## 2019-10-19 NOTE — TOC Progression Note (Signed)
Transition of Care [facility]) - Progression Note    Patient Details  Name: Miguel Moore MRN: [id number] Date of Birth: Nov 24, 1985  Transition of Care [facility]) CM/SW Contact  Glennon Mac, RN Phone Number: 10/19/2019, 5:12 PM  Clinical Narrative: [facility] called; asks that we call back in AM to revisit this case.  TOC Case Manager will follow up with psych facility in AM.      Expected Discharge Plan: Psychiatric Hospital Barriers to Discharge: Continued Medical Work up  Expected Discharge Plan and Services Expected Discharge Plan: Psychiatric Hospital   Discharge Planning Services: CM Consult   Living arrangements for the past 2 months: Single Family Home                                       Social Determinants of Health (SDOH) Interventions    Readmission Risk Interventions No flowsheet data found.  Quintella Baton, RN, BSN  Trauma/Neuro ICU Case Manager [phone]

## 2019-10-20 ENCOUNTER — Inpatient Hospital Stay (HOSPITAL_COMMUNITY)
Admission: AD | Admit: 2019-10-20 | Discharge: 2019-10-24 | DRG: 885 | Disposition: A | Discharge status: Home / Self Care | Payer: Self-pay | Attending: Psychiatry | Admitting: Psychiatry

## 2019-10-20 ENCOUNTER — Other Ambulatory Visit: Payer: Self-pay

## 2019-10-20 ENCOUNTER — Encounter (HOSPITAL_COMMUNITY): Payer: Self-pay | Admitting: Family

## 2019-10-20 DIAGNOSIS — G4700 Insomnia, unspecified: Secondary | ICD-10-CM | POA: Diagnosis present

## 2019-10-20 DIAGNOSIS — F332 Major depressive disorder, recurrent severe without psychotic features: Principal | ICD-10-CM | POA: Diagnosis present

## 2019-10-20 DIAGNOSIS — Z915 Personal history of self-harm: Secondary | ICD-10-CM

## 2019-10-20 DIAGNOSIS — Z818 Family history of other mental and behavioral disorders: Secondary | ICD-10-CM

## 2019-10-20 DIAGNOSIS — F419 Anxiety disorder, unspecified: Secondary | ICD-10-CM | POA: Diagnosis present

## 2019-10-20 DIAGNOSIS — F1524 Other stimulant dependence with stimulant-induced mood disorder: Secondary | ICD-10-CM | POA: Diagnosis present

## 2019-10-20 DIAGNOSIS — F1520 Other stimulant dependence, uncomplicated: Secondary | ICD-10-CM

## 2019-10-20 DIAGNOSIS — F17210 Nicotine dependence, cigarettes, uncomplicated: Secondary | ICD-10-CM | POA: Diagnosis present

## 2019-10-20 HISTORY — DX: Attention-deficit hyperactivity disorder, unspecified type: F90.9

## 2019-10-20 HISTORY — DX: Depression, unspecified: F32.A

## 2019-10-20 HISTORY — DX: Anxiety disorder, unspecified: F41.9

## 2019-10-20 HISTORY — DX: Meningitis, unspecified: G03.9

## 2019-10-20 LAB — BASIC METABOLIC PANEL
Anion gap: 10 (ref 5–15)
BUN: 8 mg/dL (ref 6–20)
CO2: 26 mmol/L (ref 22–32)
Calcium: 9.3 mg/dL (ref 8.9–10.3)
Chloride: 101 mmol/L (ref 98–111)
Creatinine, Ser: 0.89 mg/dL (ref 0.61–1.24)
GFR calc Af Amer: >60 mL/min (ref >60)
GFR calc non Af Amer: >60 mL/min (ref >60)
Glucose, Bld: 127 mg/dL — ABNORMAL HIGH (ref 70–99)
Potassium: 4.1 mmol/L (ref 3.5–5.1)
Sodium: 137 mmol/L (ref 135–145)

## 2019-10-20 LAB — CBC
HCT: 33.5 % — ABNORMAL LOW (ref 39.0–52.0)
Hemoglobin: 11.6 g/dL — ABNORMAL LOW (ref 13.0–17.0)
MCH: 30.4 pg (ref 26.0–34.0)
MCHC: 34.6 g/dL (ref 30.0–36.0)
MCV: 87.7 fL (ref 80.0–100.0)
Platelets: 227 10*3/uL (ref 150–400)
RBC: 3.82 MIL/uL — ABNORMAL LOW (ref 4.22–5.81)
RDW: 11.8 % (ref 11.5–15.5)
WBC: 6.7 10*3/uL (ref 4.0–10.5)
nRBC: 0 % (ref 0.0–0.2)

## 2019-10-20 MED ORDER — ACETAMINOPHEN 325 MG PO TABS
650.0000 mg | ORAL_TABLET | Freq: Four times a day (QID) | ORAL | Status: DC | PRN
  Administered 2019-10-20 – 2019-10-24 (×7): 650 mg via ORAL
  Filled 2019-10-20 (×8): qty 2

## 2019-10-20 MED ORDER — WHITE PETROLATUM EX OINT
TOPICAL_OINTMENT | CUTANEOUS | Status: AC
  Filled 2019-10-20: qty 5

## 2019-10-20 MED ORDER — TRAZODONE HCL 100 MG PO TABS
100.0000 mg | ORAL_TABLET | Freq: Every evening | ORAL | Status: DC | PRN
  Administered 2019-10-20: 100 mg via ORAL
  Filled 2019-10-20: qty 1

## 2019-10-20 MED ORDER — DOCUSATE SODIUM 100 MG PO CAPS: 100.0000 mg | ORAL_CAPSULE | Freq: Every day | ORAL | Status: DC | PRN

## 2019-10-20 MED ORDER — NICOTINE 21 MG/24HR TD PT24
21.0000 mg | MEDICATED_PATCH | Freq: Every day | TRANSDERMAL | Status: DC
  Administered 2019-10-20: 21 mg via TRANSDERMAL
  Filled 2019-10-20: qty 1

## 2019-10-20 MED ORDER — THIAMINE HCL 100 MG PO TABS: 100.0000 mg | ORAL_TABLET | Freq: Every day | ORAL | Status: AC

## 2019-10-20 MED ORDER — BUPROPION HCL ER (XL) 150 MG PO TB24: 150.0000 mg | ORAL_TABLET | Freq: Every day | ORAL | Status: DC

## 2019-10-20 MED ORDER — MAGNESIUM HYDROXIDE 400 MG/5ML PO SUSP: 30.0000 mL | Freq: Every day | ORAL | Status: DC | PRN

## 2019-10-20 MED ORDER — ENSURE ENLIVE PO LIQD
237.0000 mL | Freq: Two times a day (BID) | ORAL | Status: DC
  Administered 2019-10-21: 237 mL via ORAL

## 2019-10-20 MED ORDER — ADULT MULTIVITAMIN W/MINERALS CH: 1.0000 | ORAL_TABLET | Freq: Every day | ORAL | Status: AC

## 2019-10-20 MED ORDER — BACITRACIN-NEOMYCIN-POLYMYXIN OINTMENT TUBE
TOPICAL_OINTMENT | CUTANEOUS | Status: DC | PRN
  Administered 2019-10-22: 1 via TOPICAL
  Filled 2019-10-20: qty 14.17

## 2019-10-20 MED ORDER — ALUM & MAG HYDROXIDE-SIMETH 200-200-20 MG/5ML PO SUSP: 30.0000 mL | ORAL | Status: DC | PRN

## 2019-10-20 MED ORDER — FOLIC ACID 1 MG PO TABS: 1.0000 mg | ORAL_TABLET | Freq: Every day | ORAL | Status: DC

## 2019-10-20 NOTE — Progress Notes (Signed)
   Trauma/Critical Care Follow Up Note  Subjective:    Overnight Issues:   Objective:  Vital signs for last 24 hours: Temp:  [97.6 F (36.4 C)-98.4 F (36.9 C)] 97.8 F (36.6 C) (05/25 0754) Pulse Rate:  [53-79] 75 (05/25 0902) Resp:  [10-20] 20 (05/25 0754) BP: (110-140)/(55-86) 126/82 (05/25 0900) SpO2:  [85 %-99 %] 99 % (05/25 0902)  Hemodynamic parameters for last 24 hours:    Intake/Output from previous day: 05/24 0701 - 05/25 0700 In: 1248.8 [P.O.:1080; I.V.:168.8] Out: -   Intake/Output this shift: No intake/output data recorded.  Vent settings for last 24 hours:    Physical Exam:  Gen: comfortable, no distress Neuro: non-focal exam HEENT: PERRL Neck: supple, incision c/d/i with suture CV: RRR Pulm: unlabored breathing Abd: soft, NT GU: spont voids  Extr: wwp, no edema   Results for orders placed or performed during the hospital encounter of 10/16/19 (from the past 24 hour(s))  Basic metabolic panel     Status: Abnormal   Collection Time: 10/20/19  7:19 AM  Result Value Ref Range   Sodium 137 135 - 145 mmol/L   Potassium 4.1 3.5 - 5.1 mmol/L   Chloride 101 98 - 111 mmol/L   CO2 26 22 - 32 mmol/L   Glucose, Bld 127 (H) 70 - 99 mg/dL   BUN 8 6 - 20 mg/dL   Creatinine, Ser 2.09 0.61 - 1.24 mg/dL   Calcium 9.3 8.9 - 47.0 mg/dL   GFR calc non Af Amer >60 >60 mL/min   GFR calc Af Amer >60 >60 mL/min   Anion gap 10 5 - 15  CBC     Status: Abnormal   Collection Time: 10/20/19  7:19 AM  Result Value Ref Range   WBC 6.7 4.0 - 10.5 K/uL   RBC 3.82 (L) 4.22 - 5.81 MIL/uL   Hemoglobin 11.6 (L) 13.0 - 17.0 g/dL   HCT 96.2 (L) 83.6 - 62.9 %   MCV 87.7 80.0 - 100.0 fL   MCH 30.4 26.0 - 34.0 pg   MCHC 34.6 30.0 - 36.0 g/dL   RDW 47.6 54.6 - 50.3 %   Platelets 227 150 - 400 K/uL   nRBC 0.0 0.0 - 0.2 %    Assessment & Plan: The plan of care was discussed with the bedside nurse for the day, Janelle Floor, who is in agreement with this plan and no additional  concerns were raised.   Present on Admission: . Laceration of muscle of neck    LOS: 4 days   Additional comments:I reviewed the patient's new clinical lab test results.   and I reviewed the patients new imaging test results.    SISWto theneck with neck laceration - CTA negative for vascular injury. S/p repair in ED 5/21 Dr. Janee Morn. Psychiatric illness, ?Bipolar/schizophrenia, depression - psych recs for IP psych admission, medically cleared for admission PSA - meth use prior to admission, SW consult for SBIRT, add nicotine patch today FEN - regular diet DVT - LMWH, ambulate Dispo - med-surg   Diamantina Monks, MD Trauma & General Surgery Please use [email or web address] to contact on call provider  10/20/2019  *Care during the described time interval was provided by me. I have reviewed this patient's available data, including medical history, events of note, physical examination and test results as part of my evaluation.

## 2019-10-20 NOTE — Progress Notes (Signed)
   10/20/19 2339  Psych Admission Type (Psych Patients Only)  Admission Status Involuntary  Psychosocial Assessment  Patient Complaints Depression  Eye Contact Fair  Facial Expression Flat  Affect Appropriate to circumstance;Depressed  Speech Logical/coherent  Interaction Assertive  Motor Activity Other (Comment) (WDL)  Appearance/Hygiene In scrubs  Behavior Characteristics Cooperative  Mood Depressed  Thought Process  Coherency WDL  Content WDL  Delusions None reported or observed  Perception WDL  Hallucination None reported or observed  Judgment WDL  Confusion WDL  Danger to Self  Current suicidal ideation? Denies  Danger to Others  Danger to Others None reported or observed  D: Patient in dayroom reports his day was better. Pt reports he need to get better for his 2 year child. A: Medications administered as prescribed. Support and encouragement provided as needed.  R: Patient remains safe on the unit. Will continue to monitor for safety and stability.

## 2019-10-20 NOTE — Tx Team (Signed)
Initial Treatment Plan 10/20/2019 5:59 PM [name] SYV:[id number]    PATIENT STRESSORS: Substance abuse   PATIENT STRENGTHS: Average or above average intelligence Capable of independent living Communication skills General fund of knowledge Motivation for treatment/growth Physical Health Supportive family/friends Work skills   PATIENT IDENTIFIED PROBLEMS:                      DISCHARGE CRITERIA:  Improved stabilization in mood, thinking, and/or behavior Motivation to continue treatment in a less acute level of care Need for constant or close observation no longer present Reduction of life-threatening or endangering symptoms to within safe limits Verbal commitment to aftercare and medication compliance  PRELIMINARY DISCHARGE PLAN: Outpatient therapy Return to previous living arrangement Return to previous work or school arrangements  PATIENT/FAMILY INVOLVEMENT: This treatment plan has been presented to and reviewed with the patient, Miguel Moore, and/or family member.  The patient and family have been given the opportunity to ask questions and make suggestions.  Hoover Browns, RN 10/20/2019, 5:59 PM
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
## 2019-10-20 NOTE — Discharge Instructions (Signed)
Incision Care, Adult An incision is a surgical cut that is made through your skin. Most incisions are closed after surgery. Your incision may be closed with stitches (sutures), staples, skin glue, or adhesive strips. You may need to return to your health care provider to have sutures or staples removed. This may occur several days to several weeks after your surgery. The incision needs to be cared for properly to prevent infection. How to care for your incision Incision care   Follow instructions from your health care provider about how to take care of your incision. Make sure you: ? Wash your hands with soap and water before you change the bandage (dressing). If soap and water are not available, use hand sanitizer. ? Change your dressing as told by your health care provider. ? Leave sutures, skin glue, or adhesive strips in place. These skin closures may need to stay in place for 2 weeks or longer. If adhesive strip edges start to loosen and curl up, you may trim the loose edges. Do not remove adhesive strips completely unless your health care provider tells you to do that.  Check your incision area every day for signs of infection. Check for: ? More redness, swelling, or pain. ? More fluid or blood. ? Warmth. ? Pus or a bad smell.  Ask your health care provider how to clean the incision. This may include: ? Using mild soap and water. ? Using a clean towel to pat the incision dry after cleaning it. ? Applying a cream or ointment. Do this only as told by your health care provider. ? Covering the incision with a clean dressing.  Ask your health care provider when you can leave the incision uncovered.  Do not take baths, swim, or use a hot tub until your health care provider approves. Ask your health care provider if you can take showers. You may only be allowed to take sponge baths for bathing. Medicines  If you were prescribed an antibiotic medicine, cream, or ointment, take or apply the  antibiotic as told by your health care provider. Do not stop taking or applying the antibiotic even if your condition improves.  Take over-the-counter and prescription medicines only as told by your health care provider. General instructions  Limit movement around your incision to improve healing. ? Avoid straining, lifting, or exercise for the first month, or for as long as told by your health care provider. ? Follow instructions from your health care provider about returning to your normal activities. ? Ask your health care provider what activities are safe.  Protect your incision from the sun when you are outside for the first 6 months, or for as long as told by your health care provider. Apply sunscreen around the scar or cover it up.  Keep all follow-up visits as told by your health care provider. This is important. Contact a health care provider if:  Your have more redness, swelling, or pain around the incision.  You have more fluid or blood coming from the incision.  Your incision feels warm to the touch.  You have pus or a bad smell coming from the incision.  You have a fever or shaking chills.  You are nauseous or you vomit.  You are dizzy.  Your sutures or staples come undone. Get help right away if:  You have a red streak coming from your incision.  Your incision bleeds through the dressing and the bleeding does not stop with gentle pressure.  The edges of   your incision open up and separate.  You have severe pain.  You have a rash.  You are confused.  You faint.  You have trouble breathing and a fast heartbeat. This information is not intended to replace advice given to you by your health care provider. Make sure you discuss any questions you have with your health care provider. Document Revised: 05/16/2018 Document Reviewed: 11/30/2015 Elsevier Patient Education  2020 Elsevier Inc.   

## 2019-10-20 NOTE — BHH Group Notes (Signed)
Adult Psychoeducational Group Note  Date:  10/20/2019 Time:  10:26 PM  Group Topic/Focus:  Wrap-Up Group:   The focus of this group is to help patients review their daily goal of treatment and discuss progress on daily workbooks.  Participation Level:  Active  Participation Quality:  Appropriate and Attentive  Affect:  Appropriate  Cognitive:  Alert and Appropriate  Insight: Appropriate and Good  Engagement in Group:  Engaged  Modes of Intervention:  Discussion and Education  Additional Comments:  Pt attended and participated in wrap up group this evening and rated their day a 6/10, due to it being their first day here. Pt is happy to be alive and be in the ICU. Pt goal while they are here is to get their medications managed properly.   Miguel Moore 10/20/2019, 10:26 PM

## 2019-10-20 NOTE — TOC Transition Note (Signed)
Transition of Care [facility]) - CM/SW Discharge Note   Patient Details  Name: Miguel Moore MRN: [id number] Date of Birth: 26-Jun-1985  [facility] CM/SW Contact:  Glennon Mac, RN Phone Number: 10/20/2019, 3:18 PM   Clinical Narrative:   Pt accepted to [facility], room 307-01 for admission today ASAP.  Attending MD is Dr. Jama Flavors; accepting provider is Kayren Eaves, NP.  Bedside nurse notified to call report to [phone].  Non-emergent [employer] to transport; called for transport at [phone].      Final next level of care: Psychiatric Hospital Barriers to Discharge: Barriers Resolved            Discharge Plan and Services   Discharge Planning Services: CM Consult                                 Social Determinants of Health (SDOH) Interventions     Readmission Risk Interventions No flowsheet data found.  Quintella Baton, RN, BSN  Trauma/Neuro ICU Case Manager [phone]
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
## 2019-10-21 DIAGNOSIS — F332 Major depressive disorder, recurrent severe without psychotic features: Principal | ICD-10-CM

## 2019-10-21 DIAGNOSIS — T1491XA Suicide attempt, initial encounter: Secondary | ICD-10-CM

## 2019-10-21 DIAGNOSIS — F1520 Other stimulant dependence, uncomplicated: Secondary | ICD-10-CM

## 2019-10-21 LAB — HEMOGLOBIN A1C
Hgb A1c MFr Bld: 5.2 % (ref 4.8–5.6)
Mean Plasma Glucose: 102.54 mg/dL

## 2019-10-21 LAB — TSH: TSH: 4.668 u[IU]/mL — ABNORMAL HIGH (ref 0.350–4.500)

## 2019-10-21 LAB — LIPID PANEL
Cholesterol: 152 mg/dL (ref 0–200)
HDL: 29 mg/dL — ABNORMAL LOW (ref >40)
LDL Cholesterol: 109 mg/dL — ABNORMAL HIGH (ref 0–99)
Total CHOL/HDL Ratio: 5.2 RATIO
Triglycerides: 70 mg/dL (ref <150)
VLDL: 14 mg/dL (ref 0–40)

## 2019-10-21 MED ORDER — HYDROXYZINE HCL 25 MG PO TABS
25.0000 mg | ORAL_TABLET | Freq: Four times a day (QID) | ORAL | Status: DC | PRN
  Administered 2019-10-21 – 2019-10-23 (×4): 25 mg via ORAL
  Filled 2019-10-21: qty 1
  Filled 2019-10-21: qty 10
  Filled 2019-10-21 (×3): qty 1

## 2019-10-21 MED ORDER — ONDANSETRON 4 MG PO TBDP
4.0000 mg | ORAL_TABLET | Freq: Four times a day (QID) | ORAL | Status: DC | PRN
Start: 2019-10-21 — End: 2019-10-21

## 2019-10-21 MED ORDER — LOPERAMIDE HCL 2 MG PO CAPS
2.0000 mg | ORAL_CAPSULE | ORAL | Status: DC | PRN
Start: 2019-10-21 — End: 2019-10-21

## 2019-10-21 MED ORDER — LORAZEPAM 1 MG PO TABS: 1.0000 mg | ORAL_TABLET | Freq: Four times a day (QID) | ORAL | Status: DC | PRN

## 2019-10-21 MED ORDER — TRAZODONE HCL 50 MG PO TABS
50.0000 mg | ORAL_TABLET | Freq: Every evening | ORAL | Status: DC | PRN
  Administered 2019-10-21 – 2019-10-23 (×3): 50 mg via ORAL
  Filled 2019-10-21: qty 7
  Filled 2019-10-21 (×3): qty 1

## 2019-10-21 MED ORDER — BUPROPION HCL ER (XL) 150 MG PO TB24
150.0000 mg | ORAL_TABLET | Freq: Every day | ORAL | Status: DC
  Administered 2019-10-21 – 2019-10-24 (×4): 150 mg via ORAL
  Filled 2019-10-21 (×5): qty 1
  Filled 2019-10-21: qty 7
  Filled 2019-10-21 (×2): qty 1

## 2019-10-21 MED ORDER — THIAMINE HCL 100 MG/ML IJ SOLN: 100.0000 mg | Freq: Once | INTRAMUSCULAR | Status: DC

## 2019-10-21 MED ORDER — NICOTINE 21 MG/24HR TD PT24
21.0000 mg | MEDICATED_PATCH | Freq: Every day | TRANSDERMAL | Status: DC
  Administered 2019-10-22 – 2019-10-23 (×2): 21 mg via TRANSDERMAL
  Filled 2019-10-21 (×5): qty 1

## 2019-10-21 MED ORDER — ADULT MULTIVITAMIN W/MINERALS CH
1.0000 | ORAL_TABLET | Freq: Every day | ORAL | Status: DC
  Administered 2019-10-22 – 2019-10-24 (×3): 1 via ORAL
  Filled 2019-10-21 (×5): qty 1

## 2019-10-21 MED ORDER — THIAMINE HCL 100 MG PO TABS
100.0000 mg | ORAL_TABLET | Freq: Every day | ORAL | Status: DC
  Administered 2019-10-22 – 2019-10-24 (×3): 100 mg via ORAL
  Filled 2019-10-21 (×4): qty 1

## 2019-10-21 MED ORDER — ADULT MULTIVITAMIN W/MINERALS CH
1.0000 | ORAL_TABLET | Freq: Every day | ORAL | Status: DC
  Administered 2019-10-21: 1 via ORAL
  Filled 2019-10-21 (×3): qty 1

## 2019-10-21 NOTE — Tx Team (Signed)
Interdisciplinary Treatment and Diagnostic Plan Update  10/21/2019 Time of Session:  Miguel Moore MRN: [id number]  Principal Diagnosis: <principal problem not specified>  Secondary Diagnoses: Active Problems:   MDD (major depressive disorder), recurrent severe, without psychosis ([place])   Current Medications:  Current Facility-Administered Medications  Medication Dose Route Frequency Provider Last Rate Last Admin  . acetaminophen (TYLENOL) tablet 650 mg  650 mg Oral Q6H PRN Suella Broad, FNP   650 mg at 10/21/19 1026  . alum & mag hydroxide-simeth (MAALOX/MYLANTA) 200-200-20 MG/5ML suspension 30 mL  30 mL Oral Q4H PRN Burt Ek, Gayland Curry, FNP      . feeding supplement (ENSURE ENLIVE) (ENSURE ENLIVE) liquid 237 mL  237 mL Oral BID BM Cobos, Myer Peer, MD   237 mL at 10/21/19 1024  . magnesium hydroxide (MILK OF MAGNESIA) suspension 30 mL  30 mL Oral Daily PRN Suella Broad, FNP      . multivitamin with minerals tablet 1 tablet  1 tablet Oral Daily Cobos, Myer Peer, MD   1 tablet at 10/21/19 1024  . neomycin-bacitracin-polymyxin (NEOSPORIN) ointment   Topical PRN Cobos, Myer Peer, MD   Given at 10/20/19 2123  . traZODone (DESYREL) tablet 100 mg  100 mg Oral QHS PRN Suella Broad, FNP   100 mg at 10/20/19 2123   PTA Medications: Medications Prior to Admission  Medication Sig Dispense Refill Last Dose  . buPROPion (WELLBUTRIN XL) 150 MG 24 hr tablet Take 1 tablet (150 mg total) by mouth daily.     . folic acid (FOLVITE) 1 MG tablet Take 1 tablet (1 mg total) by mouth daily.     . Multiple Vitamin (MULTIVITAMIN WITH MINERALS) TABS tablet Take 1 tablet by mouth daily.     Marland Kitchen thiamine 100 MG tablet Take 1 tablet (100 mg total) by mouth daily.     . Vitamin D, Ergocalciferol, (DRISDOL) 1.25 MG (50000 UNIT) CAPS capsule Take 50,000 Units by mouth every 7 (seven) days.       Patient Stressors: Substance abuse  Patient Strengths: Average or above average  intelligence Capable of independent living Communication skills General fund of knowledge Motivation for treatment/growth Physical Health Supportive family/friends Work skills  Treatment Modalities: Medication Management, Group therapy, Case management,  1 to 1 session with clinician, Psychoeducation, Recreational therapy.   Physician Treatment Plan for Primary Diagnosis: <principal problem not specified> Long Term Goal(s): Improvement in symptoms so as ready for discharge   Short Term Goals: Ability to identify changes in lifestyle to reduce recurrence of condition will improve Ability to verbalize feelings will improve Ability to disclose and discuss suicidal ideas Ability to demonstrate self-control will improve Ability to identify and develop effective coping behaviors will improve Compliance with prescribed medications will improve Ability to identify triggers associated with substance abuse/mental health issues will improve  Medication Management: Evaluate patient's response, side effects, and tolerance of medication regimen.  Therapeutic Interventions: 1 to 1 sessions, Unit Group sessions and Medication administration.  Evaluation of Outcomes: Not Met  Physician Treatment Plan for Secondary Diagnosis: Active Problems:   MDD (major depressive disorder), recurrent severe, without psychosis ([place])  Long Term Goal(s): Improvement in symptoms so as ready for discharge   Short Term Goals: Ability to identify changes in lifestyle to reduce recurrence of condition will improve Ability to verbalize feelings will improve Ability to disclose and discuss suicidal ideas Ability to demonstrate self-control will improve Ability to identify and develop effective coping behaviors will improve Compliance with prescribed medications  will improve Ability to identify triggers associated with substance abuse/mental health issues will improve     Medication Management: Evaluate patient's  response, side effects, and tolerance of medication regimen.  Therapeutic Interventions: 1 to 1 sessions, Unit Group sessions and Medication administration.  Evaluation of Outcomes: Not Met   RN Treatment Plan for Primary Diagnosis: <principal problem not specified> Long Term Goal(s): Knowledge of disease and therapeutic regimen to maintain health will improve  Short Term Goals: Ability to demonstrate self-control, Ability to participate in decision making will improve, Ability to verbalize feelings will improve, Ability to disclose and discuss suicidal ideas, Ability to identify and develop effective coping behaviors will improve and Compliance with prescribed medications will improve  Medication Management: RN will administer medications as ordered by provider, will assess and evaluate patient's response and provide education to patient for prescribed medication. RN will report any adverse and/or side effects to prescribing provider.  Therapeutic Interventions: 1 on 1 counseling sessions, Psychoeducation, Medication administration, Evaluate responses to treatment, Monitor vital signs and CBGs as ordered, Perform/monitor CIWA, COWS, AIMS and Fall Risk screenings as ordered, Perform wound care treatments as ordered.  Evaluation of Outcomes: Not Met   LCSW Treatment Plan for Primary Diagnosis: <principal problem not specified> Long Term Goal(s): Safe transition to appropriate next level of care at discharge, Engage patient in therapeutic group addressing interpersonal concerns.  Short Term Goals: Engage patient in aftercare planning with referrals and resources  Therapeutic Interventions: Assess for all discharge needs, 1 to 1 time with Social worker, Explore available resources and support systems, Assess for adequacy in community support network, Educate family and significant other(s) on suicide prevention, Complete Psychosocial Assessment, Interpersonal group therapy.  Evaluation of  Outcomes: Not Met   Progress in Treatment: Attending groups: Yes. Participating in groups: Yes. Taking medication as prescribed: Yes. Toleration medication: Yes. Family/Significant other contact made: No, will contact:  if patient consents Patient understands diagnosis: Yes. Discussing patient identified problems/goals with staff: Yes. Medical problems stabilized or resolved: Yes. Denies suicidal/homicidal ideation: Yes. Issues/concerns per patient self-inventory: No. Other:   New problem(s) identified: None   New Short Term/Long Term Goal(s): Detox, medication stabilization, elimination of SI thoughts, development of comprehensive mental wellness plan.    Patient Goals: "I would like to come up with a plan for when I get out of here"   Discharge Plan or Barriers: Patient recently admitted. CSW will continue to follow and assess for appropriate referrals and possible discharge planning.    Reason for Continuation of Hospitalization: Depression Medication stabilization Suicidal ideation  Estimated Length of Stay: 3-5 days   Attendees: Patient: Miguel Moore  10/21/2019 11:04 AM  Physician:  10/21/2019 11:04 AM  Nursing:  10/21/2019 11:04 AM  RN Care Manager: 10/21/2019 11:04 AM  Social Worker: Radonna Ricker, LCSW 10/21/2019 11:04 AM  Recreational Therapist:  10/21/2019 11:04 AM  Other: Marvia Pickles, NP 10/21/2019 11:04 AM  Other:  10/21/2019 11:04 AM  Other: 10/21/2019 11:04 AM    Scribe for Treatment Team: Marylee Floras, [place] 10/21/2019 11:04 AM

## 2019-10-21 NOTE — [facility] Suicide Risk Assessment (Signed)
[facility] Admission Suicide Risk Assessment   Nursing information obtained from:  Patient Demographic factors:  Male, Caucasian Current Mental Status:  NA Loss Factors:  NA Historical Factors:  Family history of mental illness or substance abuse Risk Reduction Factors:  Sense of responsibility to family  Total Time spent with patient: 45 minutes  Principal Problem:  S/P self inflicted injury  Diagnosis:  Active Problems:   MDD (major depressive disorder), recurrent severe, without psychosis ([place])  Subjective Data:   Continued Clinical Symptoms:  Alcohol Use Disorder Identification Test Final Score (AUDIT): 32 The "Alcohol Use Disorders Identification Test", Guidelines for Use in Primary Care, Second Edition.  World Pharmacologist [facility]). Score between 0-7:  no or low risk or alcohol related problems. Score between 8-15:  moderate risk of alcohol related problems. Score between 16-19:  high risk of alcohol related problems. Score 20 or above:  warrants further diagnostic evaluation for alcohol dependence and treatment.   CLINICAL FACTORS:  6 y old male , lives alone , employed as  Insurance underwriter, 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 Presented to ED following a self inflicted laceration on his neck. (A CTA was negative for vascular injury.) He was admitted to ICU and required intubation.  He states he has no recollection of this event . 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 and reports " I remember I was hallucinating pretty bad". He states " I was seeing people and shadows, hearing all these voices ".  Admission BAL ( 5/21) was negative, no drug screen on chart  He reports that " I really don't know why I did this" and explains   prior to above event he was not feeling significantly depressed and had no suicidal ideations. He denies significant neuro-vegetative symptoms leading to admission. He denies prior history of suicide attempts or of self injurious behaviors, reports psychotic symptoms occur only when actively using stimulant drugs . He reports he has had some episodes of depression in the past but describes as mild. He does not endorse any clear history of mania or hypomania .  He reports history of substance use disorder, and describes history of methamphetamine abuse, mainly in binges. He also endorses history of alcohol use disorder, had been drinking up to 12 beers per day.  Denies medical illnesses, NKDA . He was taking Wellbutrin 75 mgrs BID prior to admission but states was taking irregularly.  He states he has been on Wellbutrin over recent months , and states he likes this medication which has been well tolerated and helpful ( Denies history of seizures )   Dx- S/P suicide attempt . Stimulant Use Disorder, Alcohol Use Disorder. Substance Induced Mood Disorder, Substance Induced Psychosis  Plan- inpatient admission. Patient reports he has done well on Wellbutrin and feels this medication has been helpful. No current WDL symptoms noted or reported: no tremors, no diaphoresis, vitals are stable. Will start Ativan PRN for alcohol WDL if needed Continue Wellbutrin XL 150 mgrs QAM  Trazodone 100 mgrs QHS PRN for insomnia     Musculoskeletal: Strength & Muscle Tone: within normal limits Gait & Station: normal Patient leans: N/A  Psychiatric Specialty Exam: Physical Exam  Review of Systems no headache,  reports some pain  on neck laceration, no active bleeding , no chest pain , no shortness of breath at room air   Blood pressure 116/64, pulse 78, temperature 97.8 F (36.6 C), temperature source Oral, resp. rate 16, height 6\' 1"  (1.854 m), weight 95.7 kg.Body mass index is 27.84 kg/m.  General Appearance:  Well Groomed  Eye Contact:  Good  Speech:  Normal Rate  Volume:  Normal  Mood:  reports mood is "OK" today and does not endorse signifcant depression at this time   Affect:  Appropriate, vaguely anxious   Thought Process:  Linear and Descriptions of Associations: Intact  Orientation:  Full (Time, Place, and Person)  Thought Content:  no hallucinations, no delusions, not internally preoccupied   Suicidal Thoughts:  No denies suicidal or self injurious ideations and states " I am grateful that I am alive "  Homicidal Thoughts:  No  Memory:  Recent and remote fair- reports he does not remember event that led to admission  Judgement:  Fair  Insight:  Fair  Psychomotor Activity:  Normal- no tremors, no restlessness or agitation  Concentration:  Concentration: Good and Attention Span: Good  Recall:  Good  Fund of Knowledge:  Good  Language:  Good  Akathisia:  Negative  Handed:  Right  AIMS (if indicated):     Assets:  Communication Skills Social Support  ADL's:  Intact  Cognition:  WNL  Sleep:  Number of Hours: 6.75      COGNITIVE FEATURES THAT CONTRIBUTE TO RISK:  Closed-mindedness and Loss of executive function    SUICIDE RISK:   Moderate:  Frequent suicidal ideation with limited intensity, and duration, some specificity in terms of plans, no associated intent, good self-control, limited dysphoria/symptomatology, some risk factors present, and identifiable protective factors, including available and accessible social support.  PLAN OF CARE: Patient will be admitted to inpatient psychiatric unit for stabilization and safety. Will provide and encourage milieu participation. Provide medication management and maked adjustments as needed.  Will follow daily.    I certify that inpatient services furnished can reasonably be expected to improve the patient's condition.   , MD 10/21/2019, 12:30 PM

## 2019-10-21 NOTE — Progress Notes (Signed)
   10/21/19 1500  Psych Admission Type (Psych Patients Only)  Admission Status Involuntary  Psychosocial Assessment  Patient Complaints None  Eye Contact Other (Comment) (good)  Facial Expression Animated  Affect Appropriate to circumstance;Depressed  Speech Logical/coherent  Interaction Assertive  Motor Activity Other (Comment) (WDL)  Appearance/Hygiene Unremarkable  Behavior Characteristics Cooperative  Mood Pleasant  Thought Process  Coherency WDL  Content WDL  Delusions None reported or observed  Perception WDL  Hallucination None reported or observed  Judgment WDL  Confusion WDL  Danger to Self  Current suicidal ideation? Denies  Danger to Others  Danger to Others None reported or observed

## 2019-10-21 NOTE — H&P (Addendum)
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 Presented to ED following a self inflicted laceration on his neck. (A CTA was negative for vascular injury.) He was admitted to ICU and required intubation.  He states he has no recollection of this event . He states "the last thing I remember clearly was going to [employer] for breakfast, the next thing is waking up in hospital". He reports history of stimulant/methamphetamine use disorder and reports he had been using methamphetamine x 2-3 days prior,and had been drinking as well on day of admission. and reports " I remember I was hallucinating pretty bad". He states " I was seeing people and shadows, hearing all these voices ".  Admission BAL ( 5/21) was negative, no drug screen on chart. He reports that " I really don't know why I did this" and explains  prior to above event he was not feeling significantly depressed and had no suicidal ideations. He denies significant neuro-vegetative symptoms leading to admission. He denies prior history of suicide attempts or of self injurious behaviors, reports psychotic symptoms occur only when actively using stimulant drugs . He reports he has had some episodes of depression in the past but describes as mild. He does not endorse any clear history of mania or hypomania. He reports history of substance use disorder, and  describes history of methamphetamine abuse, mainly in binges. He also endorses history of alcohol use disorder, had been drinking up to 12 beers per day. Denies medical illnesses, NKDA . He was taking Wellbutrin 75 mgrs BID prior to admission but states was taking irregularly.  He states he has been on Wellbutrin over recent months , and states he likes this medication which has been well tolerated and helpful. (Denies history of seizures)   Associated Signs/Symptoms:  Depression Symptoms:  depressed mood, insomnia, anxiety,  (Hypo) Manic Symptoms:  Hallucinations, Labiality of Mood,  Anxiety Symptoms:  Excessive Worry,  Psychotic Symptoms:  "I only hallucinate (Seeing things & hearing voices when usuing Methamphetamine).  PTSD Symptoms: NA  Total Time spent with patient: 1 hour  Past Psychiatric History: ADHD, Polysubstance use disorders, Alcohol use disorder.  Is the patient at risk to self? No.  Has the patient been a risk to self in the past 6 months? Yes.    Has the patient been a risk to self within the distant past? Yes.    Is the patient a risk to others? No.  Has the patient been a risk to others in the past 6 months? No.  Has the patient been a risk to others within the distant past? No.   Prior Inpatient Therapy: Denies, (But has been through some residential substance abuse treatment centers in the past).  Prior Outpatient Therapy: Denies  Alcohol Screening: 1. How often do you have a drink containing alcohol?: 4 or more times a week 2. How many drinks containing alcohol do you have on a typical day when you are drinking?: 10 or  more 3. How often do you have six or more drinks on one occasion?: Daily or almost daily AUDIT-C Score: 12 4. How often during the last year have you found that you were not able to stop drinking once you had started?: Daily or almost daily 5. How often during the last year have you failed to do what was normally expected from you because of  drinking?: Never 6. How often during the last year have you needed a first drink in the morning to get yourself going after a heavy drinking session?: Less than monthly 7. How often during the last year have you had a feeling of guilt of remorse after drinking?: Daily or almost daily 8. How often during the last year have you been unable to remember what happened the night before because you had been drinking?: Weekly 9. Have you or someone else been injured as a result of your drinking?: Yes, during the last year 10. Has a relative or friend or a doctor or another health worker been concerned about your drinking or suggested you cut down?: Yes, during the last year Alcohol Use Disorder Identification Test Final Score (AUDIT): 32  Substance Abuse History in the last 12 months: Yes.    Consequences of Substance Abuse: Discussed with the patient during this evaluation.  Medical Consequences:  Liver damage, Possible death by overdose Legal Consequences:  Arrests, jail time, Loss of driving privilege. Family Consequences:  Family discord, divorce and or separation.  Previous Psychotropic Medications: Yes (Wellbutrin).  Psychological Evaluations: No   Past Medical History:  Past Medical History:  Diagnosis Date  . ADHD (attention deficit hyperactivity disorder)   . Anxiety   . Bipolar 1 disorder ([place])   . Depression   . Meningitis   . Schizophrenia ([place])    History reviewed. No pertinent surgical history.  Family History: History reviewed. No pertinent family history.  Family Psychiatric  History: Bipolar disorder: Mother.                                                       Manic depression: Father.                                                       Completed suicide: Paternal side.                                                        Attempted suicide: Maternal side.  Tobacco Screening: Smokes a pack & half of cigarettes daily.  Social History:  Social History    Substance and Sexual Activity  Alcohol Use Yes  . Alcohol/week: 12.0 - 18.0 standard drinks  . Types: 12 - 18 Cans of beer per week   Comment: daily     Social History   Substance and Sexual Activity  Drug Use Yes  . Types: Methamphetamines   Comment: amount and frequency varies    Additional Social History:  Allergies:  No Known Allergies  Lab Results:  Results for orders placed or performed during the hospital encounter of 10/20/19 (from the past 48 hour(s))  Hemoglobin A1c     Status: None   Collection Time: 10/21/19  6:39 AM  Result Value Ref Range   Hgb A1c MFr Bld 5.2 4.8 - 5.6 %    Comment: (NOTE) Pre diabetes:          5.7%-6.4% Diabetes:              >6.4% Glycemic control for   <7.0% adults with diabetes    Mean Plasma Glucose 102.54 mg/dL    Comment: Performed at [facility], [place] [address] [place], [place] [address]  Lipid panel     Status: Abnormal   Collection Time: 10/21/19  6:39 AM  Result Value Ref Range   Cholesterol 152 0 - 200 mg/dL   Triglycerides 70 <150 mg/dL   HDL 29 (L) >40 mg/dL   Total CHOL/HDL Ratio 5.2 RATIO   VLDL 14 0 - 40 mg/dL   LDL Cholesterol 109 (H) 0 - 99 mg/dL    Comment:        Total Cholesterol/HDL:CHD Risk Coronary Heart Disease Risk Table                     Men   Women  1/2 Average Risk   3.4   3.3  Average Risk       5.0   4.4  2 X Average Risk   9.6   7.1  3 X Average Risk  23.4   11.0        Use the calculated Patient Ratio above and the CHD Risk Table to determine the patient's CHD Risk.        ATP III CLASSIFICATION (LDL):  <100     mg/dL   Optimal  100-129  mg/dL   Near or Above                    Optimal  130-159  mg/dL   Borderline  160-189  mg/dL   High  >190     mg/dL   Very High Performed at [facility] [address], [place], [place] [address]   TSH     Status: Abnormal   Collection Time: 10/21/19  6:39 AM  Result Value Ref Range   TSH 4.668 (H) 0.350 - 4.500  uIU/mL    Comment: Performed by a 3rd Generation assay with a functional sensitivity of <=0.01 uIU/mL. Performed at [facility], [place] [address] [place], [place] [address]    Blood Alcohol level:  Lab Results  Component Value Date   ETH <10 60/73/7106   Metabolic Disorder Labs:  Lab Results  Component Value Date   HGBA1C 5.2 10/21/2019   MPG 102.54 10/21/2019   No results found for: PROLACTIN Lab Results  Component Value Date   CHOL 152 10/21/2019   TRIG 70 10/21/2019   HDL 29 (L) 10/21/2019   CHOLHDL 5.2 10/21/2019   VLDL 14 10/21/2019   LDLCALC 109 (H) 10/21/2019   Current Medications: Current Facility-Administered Medications  Medication Dose Route Frequency Provider Last Rate Last Admin  . acetaminophen (TYLENOL) tablet 650 mg  650 mg Oral Q6H PRN Suella Broad, FNP   650 mg at 10/21/19 1026  . alum & mag hydroxide-simeth (MAALOX/MYLANTA) 200-200-20 MG/5ML suspension 30 mL  30 mL Oral Q4H PRN Starkes-Perry, Gayland Curry, FNP      . buPROPion (  WELLBUTRIN XL) 24 hr tablet 150 mg  150 mg Oral Daily Hope Holst A, MD      . hydrOXYzine (ATARAX/VISTARIL) tablet 25 mg  25 mg Oral Q6H PRN Nardos Putnam, Myer Peer, MD      . LORazepam (ATIVAN) tablet 1 mg  1 mg Oral Q6H PRN Tamani Durney, Myer Peer, MD      . multivitamin with minerals tablet 1 tablet  1 tablet Oral Daily Cleavon Goldman, Myer Peer, MD      . neomycin-bacitracin-polymyxin (NEOSPORIN) ointment   Topical PRN Anniemae Haberkorn, Myer Peer, MD   Given at 10/20/19 2123  . nicotine (NICODERM CQ - dosed in mg/24 hours) patch 21 mg  21 mg Transdermal Daily Nwoko, Herbert Pun I, NP      . Derrill Memo ON 10/22/2019] thiamine tablet 100 mg  100 mg Oral Daily Oluwatoni Rotunno, Myer Peer, MD      . traZODone (DESYREL) tablet 50 mg  50 mg Oral QHS PRN Dewell Monnier, Myer Peer, MD       PTA Medications: Medications Prior to Admission  Medication Sig Dispense Refill Last Dose  . buPROPion (WELLBUTRIN XL) 150 MG 24 hr tablet Take 1 tablet (150 mg total) by mouth  daily.     . folic acid (FOLVITE) 1 MG tablet Take 1 tablet (1 mg total) by mouth daily.     . Multiple Vitamin (MULTIVITAMIN WITH MINERALS) TABS tablet Take 1 tablet by mouth daily.     Marland Kitchen thiamine 100 MG tablet Take 1 tablet (100 mg total) by mouth daily.     . Vitamin D, Ergocalciferol, (DRISDOL) 1.25 MG (50000 UNIT) CAPS capsule Take 50,000 Units by mouth every 7 (seven) days.      Musculoskeletal: Strength & Muscle Tone: within normal limits Gait & Station: normal Patient leans: N/A  Psychiatric Specialty Exam: Physical Exam  Nursing note and vitals reviewed. Constitutional: He is oriented to person, place, and time. He appears well-developed.  HENT:  Head: Normocephalic. Head is with laceration ( (Self-inflicted laceration to the front of neck area with several stitches intact.).    Cardiovascular: Normal rate.  Respiratory: Effort normal.  Genitourinary:    Genitourinary Comments: Deferred   Musculoskeletal:        General: Tenderness ( Self-inflicated alceration to front of the neck area with several stitches intact, mild redness, no drainage noted.) and edema ( Self-inflicated alceration to front of the neck area with several stitches intact, mild redness, no drainage noted.) present. Normal range of motion.     Right shoulder: Laceration (Self-inflicated alceration to fron of the neck area with several stitches intact, mild redness, no drainage noted. ) present.  Neurological: He is alert and oriented to person, place, and time.  Skin: Skin is warm and dry. There is erythema ( Self-inflicated alceration to front of the neck area with several stitches intact, mild redness, no drainage noted. ).    Review of Systems  Constitutional: Negative for chills, diaphoresis and fever.  HENT: Negative for congestion, rhinorrhea, sneezing and sore throat.   Eyes: Negative for discharge.  Respiratory: Negative for cough, chest tightness, shortness of breath and wheezing.    Cardiovascular: Negative for chest pain and palpitations.  Gastrointestinal: Negative for diarrhea, nausea and vomiting.  Endocrine: Negative for cold intolerance.  Genitourinary: Negative for difficulty urinating.  Musculoskeletal: Positive for myalgias ( (To the front of the neck area)).       Self-inflicated alceration to front of the neck area with several stitches intact, mild redness, no drainage noted.  Allergic/Immunologic: Negative for environmental allergies and food allergies.       Allergies: NKDA  Neurological: Negative for dizziness, tremors, seizures, syncope, light-headedness and headaches.  Psychiatric/Behavioral: Positive for confusion, dysphoric mood, hallucinations, self-injury (Self-inflicted laceration to neck area) and sleep disturbance. Negative for agitation, behavioral problems, decreased concentration and suicidal ideas. The patient is nervous/anxious. The patient is not hyperactive.     Blood pressure 116/64, pulse 78, temperature 97.8 F (36.6 C), temperature source Oral, resp. rate 16, height _0  (1.854 m), weight 95.7 kg.Body mass index is 27.84 kg/m.  General Appearance: Casual and Fairly Groomed  with self-inflicted   Eye Contact:  Good  Speech:  Clear and Coherent and Normal Rate  Volume:  Normal  Mood:  Anxious and Depressed  Affect:  Appropriate  Thought Process:  Coherent, Goal Directed and Descriptions of Associations: Intact  Orientation:  Full (Time, Place, and Person)  Thought Content:  Logical, currently denies any hallucinations, delusions or paranoia. However, says the only time that he does experience hallucinations is when he is using Methamphetamine.  Suicidal Thoughts:  Currently denies any thoughts, plans or intent. (Has slef-inflicted laceration to front of the neck area). Positive familial hx of attempted suicide (maternal side & completed suicide paternal side).  Homicidal Thoughts:  Denies  Memory:  Immediate;   Good Recent;    Fair Remote;   Poor  Judgement:  Intact  Insight:  Present  Psychomotor Activity:  Normal  Concentration:  Concentration: Good and Attention Span: Good  Recall:  Poor  Fund of Knowledge:  Fair  Language:  Good  Akathisia:  NA  Handed:  Right  AIMS (if indicated):     Assets:  Communication Skills Desire for Improvement Physical Health Social Support  ADL's:  Intact  Cognition:  WNL  Sleep:  Number of Hours: 6.75   Treatment Plan Summary: Daily contact with patient to assess and evaluate symptoms and progress in treatment and Medication management.  Treatment Plan/Recommendations: 1. Admit for crisis management and stabilization, estimated length of stay 3-5 days.  2. Medication management to reduce current symptoms to base line and improve the patient's overall level of functioning: See [facility] for plan of care. 3. Treat health problems as indicated.  4. Develop treatment plan to decrease risk of relapse upon discharge and the need for readmission.  5. Psycho-social education regarding relapse prevention and self care.  6. Health care follow up as needed for medical problems.  7. Review, reconcile, and reinstate any pertinent home medications for other health issues where appropriate. 8. Call for consults with hospitalist for any additional specialty patient care services as needed.  Observation Level/Precautions:  15 minute checks  Laboratory:  Per ED  Psychotherapy: Group sessions  Medications: See [facility]  Consultations: As needed  Discharge Concerns: Safety, mood stability, maintaining sobriety   Estimated LOS: 2-4 days  Other: admit to the 300-hall.    Physician Treatment Plan for Primary Diagnosis: MDD (major depressive disorder), recurrent severe, without psychosis ([place])  Long Term Goal(s): Improvement in symptoms so as ready for discharge  Short Term Goals: Ability to identify changes in lifestyle to reduce recurrence of condition will improve, Ability to verbalize  feelings will improve, Ability to disclose and discuss suicidal ideas and Ability to demonstrate self-control will improve  Physician Treatment Plan for Secondary Diagnosis: Principal Problem:   MDD (major depressive disorder), recurrent severe, without psychosis ([place]) Active Problems:   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 Presented to ED following a self inflicted laceration on his neck. (A CTA was negative for vascular injury.) He was admitted to ICU and required intubation.  He states he has no recollection of this event . 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 and reports " I remember I was hallucinating pretty bad". He states " I was seeing people and shadows, hearing all these voices ".  Admission BAL ( 5/21) was negative, no drug screen on chart  He reports that " I really don't know why I did this" and explains  prior to above event he was not feeling significantly depressed and had no suicidal ideations. He denies significant neuro-vegetative symptoms leading to admission. He  denies prior history of suicide attempts or of self injurious behaviors, reports psychotic symptoms occur only when actively using stimulant drugs . He reports he has had some episodes of depression in the past but describes as mild. He does not endorse any clear history of mania or hypomania .  He reports history of substance use disorder, and describes history of methamphetamine abuse, mainly in binges. He also endorses history of alcohol use disorder, had been drinking up to 12 beers per day.  Denies medical illnesses, NKDA . He was taking Wellbutrin 75 mgrs BID prior to admission but states was taking irregularly.  He states he has been on Wellbutrin over recent months , and states he likes this medication which has been well tolerated and helpful ( Denies history of seizures )   Dx- S/P suicide attempt . Stimulant Use Disorder, Alcohol Use Disorder. Substance Induced Mood Disorder, Substance Induced Psychosis  Plan- inpatient admission. Patient reports he has done well on Wellbutrin and feels this medication has been helpful. No current WDL symptoms noted or reported: no tremors, no diaphoresis, vitals are stable. Will start Ativan PRN for alcohol WDL if needed Continue Wellbutrin XL 150 mgrs QAM  Trazodone 100 mgrs QHS PRN for insomnia

## 2019-10-21 NOTE — Progress Notes (Signed)
NUTRITION ASSESSMENT RD working remotely.   Pt identified as at risk on the Malnutrition Screen Tool  INTERVENTION: - will order 1 tablet multivitamin with minerals/day.   NUTRITION DIAGNOSIS: Unintentional weight loss related to sub-optimal intake as evidenced by pt report.   Goal: Pt to meet >/= 90% of their estimated nutrition needs.  Monitor:  PO intake  Assessment:  Patient involuntarily admitted for IV meth use and auditory hallucinations. He cut his throat (which required multiple sutures) while running from the police. Notes indicate that patient drinks 12-18 beers/day and that meth use varies.   Per chart review, weight yesterday was 211 lb and no other weight hx is available in the chart.  34 y.o. male  Height: Ht Readings from Last 1 Encounters:  10/20/19 6\' 1"  (1.854 m)    Weight: Wt Readings from Last 1 Encounters:  10/20/19 95.7 kg    Weight Hx: Wt Readings from Last 10 Encounters:  10/20/19 95.7 kg  10/16/19 80 kg    BMI:  Body mass index is 27.84 kg/m. Pt meets criteria for overweight status based on current BMI.  Estimated Nutritional Needs: Kcal: 25-30 kcal/kg Protein: > 1 gram protein/kg Fluid: 1 ml/kcal  Diet Order:  Diet Order            Diet regular Room service appropriate? Yes; Fluid consistency: Thin  Diet effective now             Pt is also offered choice of unit snacks mid-morning and mid-afternoon.  Pt is eating as desired.   Lab results and medications reviewed.      10/18/19, MS, RD, LDN, CNSC Inpatient Clinical Dietitian RD pager # available in [facility]  After hours/weekend pager # available in [facility]

## 2019-10-21 NOTE — [facility] Group Notes (Signed)
Adult Psychoeducational Group Note  Date:  10/21/2019 Time:  10:29 PM  Group Topic/Focus:  Wrap-Up Group:   The focus of this group is to help patients review their daily goal of treatment and discuss progress on daily workbooks.  Participation Level:  Active  Participation Quality:  Appropriate and Attentive  Affect:  Appropriate  Cognitive:  Alert and Appropriate  Insight: Appropriate and Good  Engagement in Group:  Engaged  Modes of Intervention:  Discussion and Education  Additional Comments:  Pt attended and participated in wrap up group this evening and rated their day a 7/10, due to them having a great day with their peers. Pt met with the Dr and expressed that they did not sleep well last night.   Cristi Loron 10/21/2019, 10:29 PM

## 2019-10-22 NOTE — Progress Notes (Signed)
[facility] MD Progress Note  10/22/2019 4:14 PM Miguel Moore  MRN:  [id number] Subjective: Patient reports he is feeling noticeably better than he did on admission.  He reports his mood is "okay".  He denies suicidal ideations at this time and presents future oriented, hopeful to return home soon and return to work. Objective: I have reviewed case with treatment team and met with patient. 34 year old male, lives alone, employed, presented to ED following a self-inflicted laceration on his neck.  Laceration was significant and required ICU admission/intubation.  Fortunately work-up was negative for vascular injury.  Patient reports he has no recollection of this event.  He reports that at the time he was intoxicated and states he has been using both alcohol and methamphetamine leading up to admission.  He reports he was experiencing hallucinations prior to admission including auditory hallucinations and "seeing people and shadows".  He reports he was not feeling particularly depressed or sad prior to this event and was not having any suicidal ideations prior either.   Today patient presents alert, attentive, calm, pleasant on approach. He states "I am feeling a lot better" which he attributes to improved sleep.  States "I finally got a good night sleep". At this time denies feeling depressed and presents with a full range of affect. He denies any suicidal or self-injurious ideations and states "I want to live, I am really grateful I am alive".  He presents future oriented and looking forward to returning to work soon. We discussed disposition options, reviewed the option of going to a rehab at discharge, he states he prefers to return home.  States he plans to abstain from alcohol/drugs and attend 12-step meetings regularly. He is not currently presenting with symptoms of withdrawal.  There are no tremors or diaphoresis, presents calm and in no acute distress, vitals are stable. Principal Problem:  Self-inflicted injury, substance-induced mood disorder Diagnosis: Principal Problem:   MDD (major depressive disorder), recurrent severe, without psychosis ([place]) Active Problems:   Methamphetamine dependence ([place])  Total Time spent with patient: 20 minutes   Past Psychiatric History:   Past Medical History:  Past Medical History:  Diagnosis Date  . ADHD (attention deficit hyperactivity disorder)   . Anxiety   . Bipolar 1 disorder ([place])   . Depression   . Meningitis   . Schizophrenia ([place])    History reviewed. No pertinent surgical history. Family History: History reviewed. No pertinent family history. Family Psychiatric  History: Social History:  Social History   Substance and Sexual Activity  Alcohol Use Yes  . Alcohol/week: 12.0 - 18.0 standard drinks  . Types: 12 - 18 Cans of beer per week   Comment: daily     Social History   Substance and Sexual Activity  Drug Use Yes  . Types: Methamphetamines   Comment: amount and frequency varies    Social History   Socioeconomic History  . Marital status: Single    Spouse name: Not on file  . Number of children: Not on file  . Years of education: Not on file  . Highest education level: Not on file  Occupational History  . Not on file  Tobacco Use  . Smoking status: Current Every Day Smoker    Packs/day: 1.00    Years: 15.00    Pack years: 15.00    Types: Cigarettes  . Smokeless tobacco: Never Used  Substance and Sexual Activity  . Alcohol use: Yes    Alcohol/week: 12.0 - 18.0 standard drinks  Types: 12 - 18 Cans of beer per week    Comment: daily  . Drug use: Yes    Types: Methamphetamines    Comment: amount and frequency varies  . Sexual activity: Yes  Other Topics Concern  . Not on file  Social History Narrative  . Not on file   Social Determinants of Health   Financial Resource Strain:   . Difficulty of Paying Living Expenses:   Food Insecurity:   . Worried About Charity fundraiser in the Last  Year:   . Arboriculturist in the Last Year:   Transportation Needs:   . Film/video editor (Medical):   Marland Kitchen Lack of Transportation (Non-Medical):   Physical Activity:   . Days of Exercise per Week:   . Minutes of Exercise per Session:   Stress:   . Feeling of Stress :   Social Connections:   . Frequency of Communication with Friends and Family:   . Frequency of Social Gatherings with Friends and Family:   . Attends Religious Services:   . Active Member of Clubs or Organizations:   . Attends Archivist Meetings:   Marland Kitchen Marital Status:    Additional Social History:   Sleep: Improved  Appetite:  Good  Current Medications: Current Facility-Administered Medications  Medication Dose Route Frequency Provider Last Rate Last Admin  . acetaminophen (TYLENOL) tablet 650 mg  650 mg Oral Q6H PRN Suella Broad, FNP   650 mg at 10/22/19 1021  . alum & mag hydroxide-simeth (MAALOX/MYLANTA) 200-200-20 MG/5ML suspension 30 mL  30 mL Oral Q4H PRN Suella Broad, FNP      . buPROPion (WELLBUTRIN XL) 24 hr tablet 150 mg  150 mg Oral Daily Zayquan Bogard, Myer Peer, MD   150 mg at 10/22/19 0753  . hydrOXYzine (ATARAX/VISTARIL) tablet 25 mg  25 mg Oral Q6H PRN Antwyne Pingree, Myer Peer, MD   25 mg at 10/21/19 2126  . LORazepam (ATIVAN) tablet 1 mg  1 mg Oral Q6H PRN Maitlyn Penza, Myer Peer, MD      . multivitamin with minerals tablet 1 tablet  1 tablet Oral Daily Alizah Sills, Myer Peer, MD   1 tablet at 10/22/19 0752  . neomycin-bacitracin-polymyxin (NEOSPORIN) ointment   Topical PRN Petrea Fredenburg, Myer Peer, MD   1 application at 99/37/16 1024  . nicotine (NICODERM CQ - dosed in mg/24 hours) patch 21 mg  21 mg Transdermal Daily Lindell Spar I, NP   21 mg at 10/22/19 0753  . thiamine tablet 100 mg  100 mg Oral Daily Deneise Getty, Myer Peer, MD   100 mg at 10/22/19 0752  . traZODone (DESYREL) tablet 50 mg  50 mg Oral QHS PRN Finlay Godbee, Myer Peer, MD   50 mg at 10/21/19 2126    Lab Results:  Results for orders placed  or performed during the hospital encounter of 10/20/19 (from the past 48 hour(s))  Hemoglobin A1c     Status: None   Collection Time: 10/21/19  6:39 AM  Result Value Ref Range   Hgb A1c MFr Bld 5.2 4.8 - 5.6 %    Comment: (NOTE) Pre diabetes:          5.7%-6.4% Diabetes:              >6.4% Glycemic control for   <7.0% adults with diabetes    Mean Plasma Glucose 102.54 mg/dL    Comment: Performed at [facility], [place] [address], [place], [place] [address]  Lipid panel  Status: Abnormal   Collection Time: 10/21/19  6:39 AM  Result Value Ref Range   Cholesterol 152 0 - 200 mg/dL   Triglycerides 70 <150 mg/dL   HDL 29 (L) >40 mg/dL   Total CHOL/HDL Ratio 5.2 RATIO   VLDL 14 0 - 40 mg/dL   LDL Cholesterol 109 (H) 0 - 99 mg/dL    Comment:        Total Cholesterol/HDL:CHD Risk Coronary Heart Disease Risk Table                     Men   Women  1/2 Average Risk   3.4   3.3  Average Risk       5.0   4.4  2 X Average Risk   9.6   7.1  3 X Average Risk  23.4   11.0        Use the calculated Patient Ratio above and the CHD Risk Table to determine the patient's CHD Risk.        ATP III CLASSIFICATION (LDL):  <100     mg/dL   Optimal  100-129  mg/dL   Near or Above                    Optimal  130-159  mg/dL   Borderline  160-189  mg/dL   High  >190     mg/dL   Very High Performed at [facility] [address] [place], [place] [address]   TSH     Status: Abnormal   Collection Time: 10/21/19  6:39 AM  Result Value Ref Range   TSH 4.668 (H) 0.350 - 4.500 uIU/mL    Comment: Performed by a 3rd Generation assay with a functional sensitivity of <=0.01 uIU/mL. Performed at [facility], [place] [address], [place], [place] [address]     Blood Alcohol level:  Lab Results  Component Value Date   ETH <10 06/05/3233    Metabolic Disorder Labs: Lab Results  Component Value Date   HGBA1C 5.2 10/21/2019   MPG 102.54 10/21/2019   No  results found for: PROLACTIN Lab Results  Component Value Date   CHOL 152 10/21/2019   TRIG 70 10/21/2019   HDL 29 (L) 10/21/2019   CHOLHDL 5.2 10/21/2019   VLDL 14 10/21/2019   LDLCALC 109 (H) 10/21/2019    Physical Findings: AIMS: Facial and Oral Movements Muscles of Facial Expression: None, normal Lips and Perioral Area: None, normal Jaw: None, normal Tongue: None, normal,Extremity Movements Upper (arms, wrists, hands, fingers): None, normal Lower (legs, knees, ankles, toes): None, normal, Trunk Movements Neck, shoulders, hips: None, normal, Overall Severity Severity of abnormal movements (highest score from questions above): None, normal Incapacitation due to abnormal movements: None, normal Patient's awareness of abnormal movements (rate only patient's report): No Awareness, Dental Status Current problems with teeth and/or dentures?: No Does patient usually wear dentures?: No  CIWA:  CIWA-Ar Total: 1 COWS:     Musculoskeletal: Strength & Muscle Tone: within normal limits Gait & Station: normal Patient leans: N/A  Psychiatric Specialty Exam: Physical Exam anterior neck wound inspected-no suture dehiscence, no pus or exudate, no bleeding, appears to be healing well  Review of Systems reports improving odynophagia (which he attributes to recent intubation),  no dysphagia, chest pain, shortness of breath, no vomiting  Blood pressure 132/75, pulse 70, temperature 98.5 F (36.9 C), temperature source Oral, resp. rate 20, height 6' 1"  (1.854 m), weight 95.7  kg.Body mass index is 27.84 kg/m.  General Appearance: Casual  Eye Contact:  Good  Speech:  Normal Rate  Volume:  Normal  Mood:  improving mood   Affect:  Appropriate and Full Range  Thought Process:  Linear and Descriptions of Associations: Intact  Orientation:  Full (Time, Place, and Person)  Thought Content:  no hallucinations, no delusions, not internally preoccupied   Suicidal Thoughts:  No denies suicidal or  self injurious ideations, denies homicidal or violent ideations  Homicidal Thoughts:  No  Memory:  recent and remote grossly intact   Judgement:  Improving   Insight:  fair- improving   Psychomotor Activity:  Normal- no psychomotor agitation  Concentration:  Concentration: Good and Attention Span: Good  Recall:  Good  Fund of Knowledge:  Good  Language:  Good  Akathisia:  Negative  Handed:  Right  AIMS (if indicated):     Assets:  Desire for Improvement Resilience  ADL's:  Intact  Cognition:  WNL  Sleep:  Number of Hours: 5.75   Assessment -  34 year old male, lives alone, employed, presented to ED following a self-inflicted laceration on his neck.  Laceration was significant and required ICU admission/intubation.  Fortunately work-up was negative for vascular injury.  Patient reports he has no recollection of this event.  He reports that at the time he was intoxicated and states he has been using both alcohol and methamphetamine leading up to admission.  He reports he was experiencing hallucinations prior to admission including auditory hallucinations and "seeing people and shadows".  He reports he was not feeling particularly depressed or sad prior to this event and was not having any suicidal ideations prior either.   Patient reports he is feeling better and at this time denies feeling depressed, presents euthymic. He denies SI and as noted , reports that he does not remember episode of cutting self and that it occurred during period of intoxication. We have reviewed negative impact that drug/alcohol abuse have had and expresses insight that " I could have died ". He states he is motivated in sobriety/abstinence at this time. We reviewed options- not currently interested in going to a rehab, states he does intend to participate in [place] or NA. Tolerating medications well thus far .    Treatment Plan Summary: Daily contact with patient to assess and evaluate symptoms and progress in  treatment, Medication management, Plan inpatient treatment and medications as below Encourage group and milieu participation Encourage efforts to work on sobriety and relapse prevention Treatment team working on disposition planning options Continue Wellbutrin XL 150 mgrs QDAY for depression Continue Ativan PRN for alcohol WDL as per CIWA protocol as needed  Continue Trazodone 50 mgrs QHS PRN for insomnia Jenne Campus, MD 10/22/2019, 4:14 PM

## 2019-10-22 NOTE — Progress Notes (Signed)
   10/21/19 2130  Psych Admission Type (Psych Patients Only)  Admission Status Involuntary  Psychosocial Assessment  Patient Complaints None  Eye Contact Fair  Facial Expression Animated  Affect Appropriate to circumstance  Interaction Assertive  Motor Activity Other (Comment) (WNL)  Appearance/Hygiene Unremarkable  Behavior Characteristics Cooperative  Mood Pleasant  Thought Process  Coherency WDL  Content WDL  Delusions None reported or observed  Perception WDL  Hallucination None reported or observed  Judgment Poor  Confusion None  Danger to Self  Current suicidal ideation? Denies  Danger to Others  Danger to Others None reported or observed

## 2019-10-22 NOTE — Progress Notes (Signed)
[facility] Group Notes:  (Nursing/MHT/Case Management/Adjunct)  Date:  10/22/2019  Time:  2045  Type of Therapy:  wrap up group  Participation Level:  Active  Participation Quality:  Appropriate, Attentive, Sharing and Supportive  Affect:  Appropriate  Cognitive:  Appropriate  Insight:  Improving  Engagement in Group:  Engaged  Modes of Intervention:  Clarification, Education and Support  Summary of Progress/Problems: Positive thinking and positive change were discussed. Pt is focused on discharge and wants to focus on positive thoughts.   Miguel Moore 10/22/2019, 10:21 PM
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
## 2019-10-22 NOTE — [facility] Suicide Risk Assessment (Signed)
[facility] INPATIENT:  Family/Significant Other Suicide Prevention Education  Suicide Prevention Education:   Patient Refusal for Family/Significant Other Suicide Prevention Education: The patient Miguel Moore has refused to provide written consent for family/significant other to be provided Family/Significant Other Suicide Prevention Education during admission and/or prior to discharge.  Physician notified.  SPE completed with patient, as patient refused to consent to family contact. SPI pamphlet provided to pt and pt was encouraged to share information with support network, ask questions, and talk about any concerns relating to SPE. Patient denies access to guns/firearms and verbalized understanding of information provided. Mobile Crisis information also provided to patient.    [name] 10/22/2019, 10:15 AM

## 2019-10-22 NOTE — Progress Notes (Signed)
   10/22/19 2208  Psych Admission Type (Psych Patients Only)  Admission Status Involuntary  Psychosocial Assessment  Patient Complaints Anxiety  Eye Contact Other (Comment) (WNL (appropriate))  Facial Expression Animated  Affect Appropriate to circumstance  Speech Logical/coherent  Interaction Assertive  Motor Activity Other (Comment) (WNL)  Appearance/Hygiene Unremarkable  Behavior Characteristics Cooperative  Mood Anxious;Pleasant  Thought Process  Coherency WDL  Content WDL  Delusions None reported or observed  Perception WDL  Hallucination None reported or observed  Judgment Poor  Confusion None  Danger to Self  Current suicidal ideation? Denies  Danger to Others  Danger to Others None reported or observed  D: Patient in dayroom reports he had a good day.  A: Medications administered as prescribed. Support and encouragement provided as needed.  R: Patient remains safe on the unit. Will continue to monitor for safety and stability.

## 2019-10-22 NOTE — Progress Notes (Signed)
Pt reports sleeping good last night and that his PRN sleep medication was helpful. His appetite has been good and his energy level has been normal. He rates his depression a 3 on a scale of 0-10 (10 being the highest), hopelessness a 2, and his anxiety a 4. He denies having any alcohol withdrawal symptoms. He did c/o of pain in his neck and throat from where he was intubated and has sutures. His PRN tylenol was effective in reducing his pain. The incision site and sutures to his neck show no signs of infection. His goal is to "stay positive, work towards discharge." To help meet his goal he said he will "keep good atittude, work with Dr. and staff." He denies SI/HI and AVH. Active listening, reassurance, and support provided. Medications administered as ordered by MD. Q 15 minute safety checks continue. Safety has been maintained.    10/22/19 0800  Psych Admission Type (Psych Patients Only)  Admission Status Involuntary  Psychosocial Assessment  Patient Complaints Anxiety;Depression  Eye Contact Other (Comment) (WNL (appropriate))  Facial Expression Animated  Affect Appropriate to circumstance  Speech Logical/coherent  Interaction Assertive  Motor Activity Other (Comment) (WNL)  Appearance/Hygiene Unremarkable  Behavior Characteristics Cooperative;Appropriate to situation  Mood Pleasant  Thought Process  Coherency WDL  Content WDL  Delusions None reported or observed  Perception WDL  Hallucination None reported or observed  Judgment Poor  Confusion None  Danger to Self  Current suicidal ideation? Denies  Danger to Others  Danger to Others None reported or observed

## 2019-10-22 NOTE — [facility] Group Notes (Addendum)
LCSW Group Therapy Note 10/22/2019 8:55 AM  Type of Therapy/Topic: Group Therapy: Emotion Regulation  Participation Level: Active   Description of Group:  The purpose of this group is to assist patients in learning to regulate negative emotions and experience positive emotions. Patients will be guided to discuss ways in which they have been vulnerable to their negative emotions. These vulnerabilities will be juxtaposed with experiences of positive emotions or situations, and patients will be challenged to use positive emotions to combat negative ones. Special emphasis will be placed on coping with negative emotions in conflict situations, and patients will process healthy conflict resolution skills.  Therapeutic Goals: 1. Patient will identify two positive emotions or experiences to reflect on in order to balance out negative emotions 2. Patient will label two or more emotions that they find the most difficult to experience 3. Patient will demonstrate positive conflict resolution skills through discussion and/or role plays  Summary of Patient Progress:  Patient provided psycho-educational workbook. Patient reviewed workbook and received information developing healthy coping skills regarding emotional regulation.    Therapeutic Modalities:  Cognitive Behavioral Therapy Feelings Identification Dialectical Behavioral Therapy   Alcario Drought Clinical Social Worker

## 2019-10-23 NOTE — Progress Notes (Signed)
   10/23/19 1102  Psych Admission Type (Psych Patients Only)  Admission Status Involuntary  Psychosocial Assessment  Patient Complaints Anxiety ("about going home")  Eye Contact Other (Comment) (WNL (appropriate))  Facial Expression Animated  Affect Appropriate to circumstance  Speech Logical/coherent  Interaction Assertive  Motor Activity Other (Comment) (WNL)  Appearance/Hygiene Unremarkable  Behavior Characteristics Cooperative  Mood Anxious  Thought Process  Coherency WDL  Content WDL  Delusions None reported or observed  Perception WDL  Hallucination None reported or observed  Judgment Poor  Confusion None  Danger to Self  Current suicidal ideation? Denies  Danger to Others  Danger to Others None reported or observed   Pt visible in hall and dayroom for majority of this shift. Presents animated, interacts well with peers and staff. Reports being anxious "about going home with everything that happened". Pt denies SI, HI and AVH when assessed. Wound on pt's neck is clean, dry and intact without signs of infection. However, pt's neck is red, one suture appeared removed but sit is intact. Remains medication compliant. Denies adverse drug reaction when assessed.  Emotional support and encouragement provided to pt. Safety checks maintained at Q 15 minutes intervals without self harm gestures or outburst thus far.  Pt tolerates all PO intake well. Cooperative with care and unit routines.

## 2019-10-23 NOTE — [facility] Group Notes (Signed)
10/23/2019 8:45am Type of Group and Topic: Psychoeducational Group: Discharge Planning  Participation Level: Active  Description of Group Discharge planning group reviews patient's anticipated discharge plans and assists patients to anticipate and address any barriers to wellness/recovery in the community. Suicide prevention education is reviewed with patients in group. Therapeutic Goals 1. Patients will state their anticipated discharge plan and mental health aftercare 2. Patients will identify potential barriers to wellness in the community setting 3. Patients will engage in problem solving, solution focused discussion of ways to anticipate and address barriers to wellness/recovery   Summary of Patient Progress Plan for Discharge/Comments: Miguel Moore reports he does not have any concerns regarding discharge, with the exception of his self-sabotaging behaviors. He states he plans to work with his AA family to remain sobriety.   Transportation Means: To be determined    Supports: Girlfriend, girlfriend's family, some of his family members    Therapeutic Modalities: Motivational Interviewing     Baldo Daub, MSW, LCSWA Clinical Social Worker [facility]  Phone: [phone] 10/23/2019 2:09 PM

## 2019-10-23 NOTE — Progress Notes (Signed)
   10/23/19 2115  COVID-19 Daily Checkoff  Have you had a fever (temp > 37.80C/100F)  in the past 24 hours?  No  If you have had runny nose, nasal congestion, sneezing in the past 24 hours, has it worsened? No  COVID-19 EXPOSURE  Have you traveled outside the state in the past 14 days? No  Have you been in contact with someone with a confirmed diagnosis of COVID-19 or PUI in the past 14 days without wearing appropriate PPE? No  Have you been living in the same home as a person with confirmed diagnosis of COVID-19 or a PUI (household contact)? No  Have you been diagnosed with COVID-19? No

## 2019-10-23 NOTE — Progress Notes (Signed)
[facility] MD Progress Note  10/23/2019 1:18 PM Miguel Moore  MRN:  [id number] Subjective: patient reports he is feeling " all right". He describes his mood as improved and currently denies feeling depressed. Denies suicidal or self injurious ideations. Denies medication side effects.  Objective: I have reviewed case with treatment team and met with patient. 34 year old male, lives alone, employed, presented to ED following a self-inflicted laceration on his neck.  Laceration was significant and required ICU admission/intubation.  Fortunately work-up was negative for vascular injury.  Patient reports he has no recollection of this event.  He reports that at the time he was intoxicated and states he has been using both alcohol and methamphetamine leading up to admission.  He reports he was experiencing hallucinations prior to admission including auditory hallucinations and "seeing people and shadows".  He reports he was not feeling particularly depressed or sad prior to this event and was not having any suicidal ideations prior either.   Patient presents alert, attentive, calm, pleasant on approach. He reports he is feeling " all right", denies significant depression or neuro-vegetative symptoms at this time. Denies suicidal ideations or self injurious ideations. Presents future oriented, and is hoping to be able to return to work soon. He expresses some worry  about what he will tell clients if they inquire about his neck laceration/wound. States " I have told my family and they are supportive, but I don't know I want to te the people who hire me what happened ". We reviewed, responds well  to empathy/validation. Behavior on unit is calm and in good control. Has been visible in day room and interacting appropriately with peers. Denies medication side effects. ( On Wellbutrin XL )  Neck laceration ( sutured) inspected- healing well - no erythema , no bleeding, no exudate, no dehiscence .   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 ADHD (attention deficit hyperactivity disorder)   . Anxiety   . Bipolar 1 disorder ([place])   . Depression   . Meningitis   . Schizophrenia ([place])    History reviewed. No pertinent surgical history. Family History: History reviewed. No pertinent family history. Family Psychiatric  History: Social History:  Social History   Substance and Sexual Activity  Alcohol Use Yes  . Alcohol/week: 12.0 - 18.0 standard drinks  . Types: 12 - 18 Cans of beer per week   Comment: daily     Social History   Substance and Sexual Activity  Drug Use Yes  . Types: Methamphetamines   Comment: amount and frequency varies    Social History   Socioeconomic History  . Marital status: Single    Spouse name: Not on file  . Number of children: Not on file  . Years of education: Not on file  . Highest education level: Not on file  Occupational History  . Not on file  Tobacco Use  . Smoking status: Current Every Day Smoker    Packs/day: 1.00    Years: 15.00    Pack years: 15.00    Types: Cigarettes  . Smokeless tobacco: Never Used  Substance and Sexual Activity  . Alcohol use: Yes    Alcohol/week: 12.0 - 18.0 standard drinks    Types:  12 - 18 Cans of beer per week    Comment: daily  . Drug use: Yes    Types: Methamphetamines    Comment: amount and frequency varies  . Sexual activity: Yes  Other Topics Concern  . Not on file  Social History Narrative  . Not on file   Social Determinants of Health   Financial Resource Strain:   . Difficulty of Paying Living Expenses:   Food Insecurity:   . Worried About Charity fundraiser in  the Last Year:   . Arboriculturist in the Last Year:   Transportation Needs:   . Film/video editor (Medical):   Marland Kitchen Lack of Transportation (Non-Medical):   Physical Activity:   . Days of Exercise per Week:   . Minutes of Exercise per Session:   Stress:   . Feeling of Stress :   Social Connections:   . Frequency of Communication with Friends and Family:   . Frequency of Social Gatherings with Friends and Family:   . Attends Religious Services:   . Active Member of Clubs or Organizations:   . Attends Archivist Meetings:   Marland Kitchen Marital Status:    Additional Social History:   Sleep: Improved  Appetite:  Good  Current Medications: Current Facility-Administered Medications  Medication Dose Route Frequency Provider Last Rate Last Admin  . acetaminophen (TYLENOL) tablet 650 mg  650 mg Oral Q6H PRN Suella Broad, FNP   650 mg at 10/23/19 0646  . alum & mag hydroxide-simeth (MAALOX/MYLANTA) 200-200-20 MG/5ML suspension 30 mL  30 mL Oral Q4H PRN Starkes-Perry, Gayland Curry, FNP      . buPROPion (WELLBUTRIN XL) 24 hr tablet 150 mg  150 mg Oral Daily Maydell Knoebel, Myer Peer, MD   150 mg at 10/23/19 0818  . hydrOXYzine (ATARAX/VISTARIL) tablet 25 mg  25 mg Oral Q6H PRN Devyne Hauger, Myer Peer, MD   25 mg at 10/22/19 2119  . LORazepam (ATIVAN) tablet 1 mg  1 mg Oral Q6H PRN Niv Darley, Myer Peer, MD      . multivitamin with minerals tablet 1 tablet  1 tablet Oral Daily Dariana Garbett, Myer Peer, MD   1 tablet at 10/23/19 0818  . neomycin-bacitracin-polymyxin (NEOSPORIN) ointment   Topical PRN Brinley Treanor, Myer Peer, MD   1 application at 29/93/71 1024  . nicotine (NICODERM CQ - dosed in mg/24 hours) patch 21 mg  21 mg Transdermal Daily Lindell Spar I, NP   21 mg at 10/23/19 0820  . thiamine tablet 100 mg  100 mg Oral Daily Shakil Dirk, Myer Peer, MD   100 mg at 10/23/19 0818  . traZODone (DESYREL) tablet 50 mg  50 mg Oral QHS PRN Karyn Brull, Myer Peer, MD   50 mg at 10/22/19 2120    Lab Results:  No results found  for this or any previous visit (from the past 48 hour(s)).  Blood Alcohol level:  Lab Results  Component Value Date   ETH <10 69/67/8938    Metabolic Disorder Labs: Lab Results  Component Value Date   HGBA1C 5.2 10/21/2019   MPG 102.54 10/21/2019   No results found for: PROLACTIN Lab Results  Component Value Date   CHOL 152 10/21/2019   TRIG 70 10/21/2019   HDL 29 (L) 10/21/2019   CHOLHDL 5.2 10/21/2019   VLDL 14 10/21/2019   LDLCALC 109 (H) 10/21/2019    Physical Findings: AIMS: Facial and Oral Movements Muscles of Facial Expression: None, normal Lips and Perioral Area: None, normal Jaw:  None, normal Tongue: None, normal,Extremity Movements Upper (arms, wrists, hands, fingers): None, normal Lower (legs, knees, ankles, toes): None, normal, Trunk Movements Neck, shoulders, hips: None, normal, Overall Severity Severity of abnormal movements (highest score from questions above): None, normal Incapacitation due to abnormal movements: None, normal Patient's awareness of abnormal movements (rate only patient's report): No Awareness, Dental Status Current problems with teeth and/or dentures?: No Does patient usually wear dentures?: No  CIWA:  CIWA-Ar Total: 0 COWS:     Musculoskeletal: Strength & Muscle Tone: within normal limits Gait & Station: normal Patient leans: N/A  Psychiatric Specialty Exam: Physical Exam   Review of Systems no shortness of breath, no cough, no vomiting  Blood pressure 130/76, pulse 74, temperature 97.8 F (36.6 C), temperature source Oral, resp. rate 18, height 6' 1"  (1.854 m), weight 95.7 kg, SpO2 100 %.Body mass index is 27.84 kg/m.  General Appearance: Casual  Eye Contact:  Good  Speech:  Normal Rate  Volume:  Normal  Mood:  improved, euthymic  Affect:  Full Range  Thought Process:  Linear and Descriptions of Associations: Intact  Orientation:  Full (Time, Place, and Person)  Thought Content:  no hallucinations, no delusions, not  internally preoccupied   Suicidal Thoughts:  No denies suicidal or self injurious ideations, denies homicidal or violent ideations  Homicidal Thoughts:  No  Memory:  recent and remote grossly intact   Judgement:  Improving   Insight:  improving   Psychomotor Activity:  Normal- no psychomotor agitation  Concentration:  Concentration: Good and Attention Span: Good  Recall:  Good  Fund of Knowledge:  Good  Language:  Good  Akathisia:  Negative  Handed:  Right  AIMS (if indicated):     Assets:  Desire for Improvement Resilience  ADL's:  Intact  Cognition:  WNL  Sleep:  Number of Hours: 6   Assessment -  34 year old male, lives alone, employed, presented to ED following a self-inflicted laceration on his neck.  Laceration was significant and required ICU admission/intubation.  Fortunately work-up was negative for vascular injury.  Patient reports he has no recollection of this event.  He reports that at the time he was intoxicated and states he has been using both alcohol and methamphetamine leading up to admission.  He reports he was experiencing hallucinations prior to admission including auditory hallucinations and "seeing people and shadows".  He reports he was not feeling particularly depressed or sad prior to this event and was not having any suicidal ideations prior either.   Today presents euthymic with appropriate/full range affect. Denies SI and presents future oriented . Denies medication side effects ( Wellbutrin XL) .Today we reviewed substance use disorder history and patient reports he is aware that self inflicted injury " happened because I was using drugs ". States he feels motivated in abstinence. He is not currently interested in going to a rehab , states he plans to go to [place] or NA meetings regularly and to access his sober support system/family/friends for support.  Treatment Plan Summary: Daily contact with patient to assess and evaluate symptoms and progress in treatment,  Medication management, Plan inpatient treatment and medications as below Encourage group and milieu participation Encourage efforts to work on sobriety and relapse prevention Treatment team working on disposition planning options- consider discharge soon as he continues to stabilize .  Continue Wellbutrin XL 150 mgrs QDAY for depression Continue Ativan PRN for alcohol WDL as per CIWA protocol as needed  Continue Trazodone 50 mgrs QHS PRN  for insomnia Jenne Campus, MD 10/23/2019, 1:18 PM   Patient ID: Miguel Moore, male   DOB: 20-Mar-1986, 34 y.o.   MRN: [id number]

## 2019-10-23 NOTE — Progress Notes (Signed)
   10/23/19 2100  Psych Admission Type (Psych Patients Only)  Admission Status Involuntary  Psychosocial Assessment  Patient Complaints None  Eye Contact Other (Comment) (WNL (appropriate))  Facial Expression Animated  Affect Appropriate to circumstance  Speech Logical/coherent  Interaction Assertive  Motor Activity Other (Comment) (WNL)  Appearance/Hygiene Unremarkable  Behavior Characteristics Cooperative;Appropriate to situation  Mood Pleasant  Thought Process  Coherency WDL  Content WDL  Delusions None reported or observed  Perception WDL  Hallucination None reported or observed  Judgment Poor  Confusion None  Danger to Self  Current suicidal ideation? Denies  Danger to Others  Danger to Others None reported or observed

## 2019-10-23 NOTE — Progress Notes (Signed)
Recreation Therapy Notes  Date:  5.28.21 Time: 0930 Location: [address] Group Room  Group Topic: Stress Management  Goal Area(s) Addresses:  Patient will identify positive stress management techniques. Patient will identify benefits of using stress management post d/c.  Intervention: Stress Management  Activity: Guided Imagery.  LRT read a script that lead patients on a journey being outside at night observing the stars in the sky.  Patients were to listen and follow along as script was read to engage in activity.   Education:  Stress Management, Discharge Planning.   Education Outcome: Acknowledges Education  Clinical Observations/Feedback: Pt did not attend activity.    Caroll Rancher, LRT/CTRS         Caroll Rancher A 10/23/2019 12:06 PM

## 2019-10-24 MED ORDER — TRAZODONE HCL 50 MG PO TABS: 50.0000 mg | ORAL_TABLET | Freq: Every evening | ORAL | 0 refills | Status: AC | PRN

## 2019-10-24 MED ORDER — BUPROPION HCL ER (XL) 150 MG PO TB24: 150.0000 mg | ORAL_TABLET | Freq: Every day | ORAL | 0 refills | Status: AC

## 2019-10-24 MED ORDER — BACITRACIN-NEOMYCIN-POLYMYXIN OINTMENT TUBE: 1.0000 "application " | TOPICAL_OINTMENT | CUTANEOUS | Status: AC | PRN

## 2019-10-24 MED ORDER — HYDROXYZINE HCL 25 MG PO TABS: 25.0000 mg | ORAL_TABLET | Freq: Four times a day (QID) | ORAL | 0 refills | Status: AC | PRN

## 2019-10-24 MED ORDER — NICOTINE 21 MG/24HR TD PT24: 21.0000 mg | MEDICATED_PATCH | Freq: Every day | TRANSDERMAL | 0 refills | Status: AC

## 2019-10-24 NOTE — Progress Notes (Signed)
Pt discharged to lobby. Pt was stable and appreciative at that time. All papers  were given and valuables returned. Verbal understanding expressed. Denies SI/HI and A/VH. Pt given opportunity to express concerns and ask questions. °

## 2019-10-24 NOTE — Progress Notes (Signed)
Pt denies SI/HI and AVH. Pt appears pleasant and excited to be going home. Active listening, reassurance, and support provided. Q 15 minute safety checks continue. Safety has been maintained.    10/24/19 0810  Psych Admission Type (Psych Patients Only)  Admission Status Involuntary  Psychosocial Assessment  Patient Complaints None  Eye Contact Other (Comment) (WNL (appropriate))  Facial Expression Animated  Affect Appropriate to circumstance  Speech Logical/coherent  Interaction Assertive  Motor Activity Other (Comment) (WNL)  Appearance/Hygiene Unremarkable  Behavior Characteristics Cooperative;Appropriate to situation;Calm  Mood Pleasant  Thought Process  Coherency WDL  Content WDL  Delusions None reported or observed  Perception WDL  Hallucination None reported or observed  Judgment Poor  Confusion None  Danger to Self  Current suicidal ideation? Denies  Danger to Others  Danger to Others None reported or observed

## 2019-10-24 NOTE — Discharge Summary (Addendum)
Physician Discharge Summary Note  Patient:  Miguel Moore is an 34 y.o., male MRN:  [id number] DOB:  December 16, 1985 Patient phone:  [phone] (home)  Patient address:   [address] [place] [address],  Total Time spent with patient: 15 minutes  Date of Admission:  10/20/2019 Date of Discharge: 10/24/19  Reason for Admission:  Self-inflicted laceration to neck  Principal Problem: MDD (major depressive disorder), recurrent severe, without psychosis (HCC) Discharge Diagnoses: Principal Problem:   MDD (major depressive disorder), recurrent severe, without psychosis (HCC) Active Problems:   Methamphetamine dependence (HCC)   Past Psychiatric History: He denies prior history of suicide attempts or of self injurious behaviors, reports psychotic symptoms occur only when actively using stimulant drugs . He reports he has had some episodes of depression in the past but describes as mild. He does not endorse any clear history of mania or hypomania .  He reports history of substance use disorder, and describes history of methamphetamine abuse, mainly in binges. He also endorses history of alcohol use disorder, had been drinking up to 12 beers per day.   Past Medical History:  Past Medical History:  Diagnosis Date  . ADHD (attention deficit hyperactivity disorder)   . Anxiety   . Bipolar 1 disorder (HCC)   . Depression   . Meningitis   . Schizophrenia (HCC)    History reviewed. No pertinent surgical history. Family History: History reviewed. No pertinent family history. Family Psychiatric  History: Bipolar disorder: Mother.                                                       Manic depression: Father.                                                       Completed suicide: Paternal side.                                                        Attempted suicide: Maternal side. Social History:  Social History   Substance and Sexual Activity  Alcohol Use Yes  . Alcohol/week: 12.0 -  18.0 standard drinks  . Types: 12 - 18 Cans of beer per week   Comment: daily     Social History   Substance and Sexual Activity  Drug Use Yes  . Types: Methamphetamines   Comment: amount and frequency varies    Social History   Socioeconomic History  . Marital status: Single    Spouse name: Not on file  . Number of children: Not on file  . Years of education: Not on file  . Highest education level: Not on file  Occupational History  . Not on file  Tobacco Use  . Smoking status: Current Every Day Smoker    Packs/day: 1.00    Years: 15.00    Pack years: 15.00    Types: Cigarettes  . Smokeless tobacco: Never Used  Substance and Sexual Activity  . Alcohol use: Yes  Alcohol/week: 12.0 - 18.0 standard drinks    Types: 12 - 18 Cans of beer per week    Comment: daily  . Drug use: Yes    Types: Methamphetamines    Comment: amount and frequency varies  . Sexual activity: Yes  Other Topics Concern  . Not on file  Social History Narrative  . Not on file   Social Determinants of Health   Financial Resource Strain:   . Difficulty of Paying Living Expenses:   Food Insecurity:   . Worried About Charity fundraiser in the Last Year:   . Arboriculturist in the Last Year:   Transportation Needs:   . Film/video editor (Medical):   Marland Kitchen Lack of Transportation (Non-Medical):   Physical Activity:   . Days of Exercise per Week:   . Minutes of Exercise per Session:   Stress:   . Feeling of Stress :   Social Connections:   . Frequency of Communication with Friends and Family:   . Frequency of Social Gatherings with Friends and Family:   . Attends Religious Services:   . Active Member of Clubs or Organizations:   . Attends Archivist Meetings:   Marland Kitchen Marital Status:     Hospital Course:  From admission H&P: 89 y old male , lives alone , employed as Insurance underwriter, 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 Presented to ED following a self inflicted laceration on his  neck. (A CTA was negative for vascular injury.) He was admitted to ICU and required intubation. He states he has no recollection of this event . He states "the last thing I remember clearly was going to [employer] for breakfast, the next thing is waking up in hospital". He reports history of stimulant/methamphetamine use disorder and reports he had been using methamphetamine x 2-3 days prior,and had been drinking as well on day of admission. and reports " I remember I was hallucinating pretty bad". He states " I was seeing people and shadows, hearing all these voices ".  Admission BAL ( 5/21) was negative, no drug screen on chart. He reports that " I really don't know why I did this" and explains prior to above event he was not feeling significantly depressed and had no suicidal ideations. He denies significant neuro-vegetative symptoms leading to admission. Denies medical illnesses, NKDA . He was taking Wellbutrin 75 mgrs BID prior to admission but states was taking irregularly. He states he has been on Wellbutrin over recent months , and states he likes this medication which has been well tolerated and helpful. (Denies history of seizures)   Mr. Rochin was admitted for self-inflicted lacerations to neck made while intoxicated. He remained on the [facility] unit for four days. CIWA protocol was started with Ativan PRN CIWA>10 for ETOH use disorder. Wellbutrin was restarted. He participated in group therapy on the unit. He responded well to treatment with no adverse effects reported. He has shown improved mood, affect, sleep, and interaction. He denies any SI/HI/AVH and contracts for safety. He is discharging on the medications listed below. He agrees to follow up at [facility] (see below). Patient is provided with prescriptions for medications upon discharge. He is discharging home via personal transportation.  Physical Findings: AIMS: Facial and Oral Movements Muscles of Facial  Expression: None, normal Lips and Perioral Area: None, normal Jaw: None, normal Tongue: None, normal,Extremity Movements Upper (arms, wrists, hands, fingers): None, normal Lower (legs, knees, ankles, toes):  None, normal, Trunk Movements Neck, shoulders, hips: None, normal, Overall Severity Severity of abnormal movements (highest score from questions above): None, normal Incapacitation due to abnormal movements: None, normal Patient's awareness of abnormal movements (rate only patient's report): No Awareness, Dental Status Current problems with teeth and/or dentures?: No Does patient usually wear dentures?: No  CIWA:  CIWA-Ar Total: 0 COWS:     Musculoskeletal: Strength & Muscle Tone: within normal limits Gait & Station: normal Patient leans: N/A  Psychiatric Specialty Exam: Physical Exam  Nursing note and vitals reviewed. Constitutional: He is oriented to person, place, and time. He appears well-developed and well-nourished.  Cardiovascular: Normal rate.  Respiratory: Effort normal.  Neurological: He is alert and oriented to person, place, and time.    Review of Systems  Constitutional: Negative.   Respiratory: Negative for cough and shortness of breath.   Psychiatric/Behavioral: Negative for agitation, behavioral problems, confusion, dysphoric mood, hallucinations, self-injury, sleep disturbance and suicidal ideas. The patient is not nervous/anxious and is not hyperactive.     Blood pressure 136/84, pulse 76, temperature (!) 97.3 F (36.3 C), temperature source Oral, resp. rate 18, height 6\' 1"  (1.854 m), weight 95.7 kg, SpO2 100 %.Body mass index is 27.84 kg/m.  See MD's discharge SRA      Has this patient used any form of tobacco in the last 30 days? (Cigarettes, Smokeless Tobacco, Cigars, and/or Pipes) Yes, a prescription for an FDA-approved medication for tobacco cessation was offered at discharge.   Blood Alcohol level:  Lab Results  Component Value Date   ETH <10  10/16/2019    Metabolic Disorder Labs:  Lab Results  Component Value Date   HGBA1C 5.2 10/21/2019   MPG 102.54 10/21/2019   No results found for: PROLACTIN Lab Results  Component Value Date   CHOL 152 10/21/2019   TRIG 70 10/21/2019   HDL 29 (L) 10/21/2019   CHOLHDL 5.2 10/21/2019   VLDL 14 10/21/2019   LDLCALC 109 (H) 10/21/2019    See Psychiatric Specialty Exam and Suicide Risk Assessment completed by Attending Physician prior to discharge.  Discharge destination:  Home  Is patient on multiple antipsychotic therapies at discharge:  No   Has Patient had three or more failed trials of antipsychotic monotherapy by history:  No  Recommended Plan for Multiple Antipsychotic Therapies: NA  Discharge Instructions    Discharge instructions   Complete by: As directed    Patient is instructed to take all prescribed medications as recommended. Report any side effects or adverse reactions to your outpatient psychiatrist. Patient is instructed to abstain from alcohol and illegal drugs while on prescription medications. In the event of worsening symptoms, patient is instructed to call the crisis hotline, 911, or go to the nearest emergency department for evaluation and treatment.     Allergies as of 10/24/2019   No Known Allergies     Medication List    STOP taking these medications   folic acid 1 MG tablet Commonly known as: FOLVITE   Vitamin D (Ergocalciferol) 1.25 MG (50000 UNIT) Caps capsule Commonly known as: DRISDOL     TAKE these medications     Indication  buPROPion 150 MG 24 hr tablet Commonly known as: WELLBUTRIN XL Take 1 tablet (150 mg total) by mouth daily.  Indication: Major Depressive Disorder   hydrOXYzine 25 MG tablet Commonly known as: ATARAX/VISTARIL Take 1 tablet (25 mg total) by mouth every 6 (six) hours as needed for anxiety.  Indication: Feeling Anxious   multivitamin with  minerals Tabs tablet Take 1 tablet by mouth daily.  Indication:  Supplementation   neomycin-bacitracin-polymyxin Oint Commonly known as: NEOSPORIN Apply 1 application topically as needed for irritation or wound care.  Indication: wound   nicotine 21 mg/24hr patch Commonly known as: NICODERM CQ - dosed in mg/24 hours Place 1 patch (21 mg total) onto the skin daily. Start taking on: Oct 25, 2019  Indication: Nicotine Addiction   thiamine 100 MG tablet Take 1 tablet (100 mg total) by mouth daily.  Indication: Supplementation   traZODone 50 MG tablet Commonly known as: DESYREL Take 1 tablet (50 mg total) by mouth at bedtime as needed for sleep.  Indication: Trouble Sleeping      Follow-up Information    [facility] [place]. Go on 11/19/2019.   Why: You are scheduled for an appointment for medication management on 11/19/19 at 9:00 am.  This appointment will be held in person.  Contact information: [address],  [place], [place] [address]  P: [phone] F: [phone]       [facility] Follow up on 10/28/2019.   Why: Your hospital discharge appointment will be held by phone on 06/02 at 12:00pm. The provider will call you. Contact information:  [address], [place], [place] [address] Phone: [phone] Fax: [phone]          Follow-up recommendations: Activity as tolerated. Diet as recommended by primary care physician. Keep all scheduled follow-up appointments as recommended.   Comments:   Patient is instructed to take all prescribed medications as recommended. Report any side effects or adverse reactions to your outpatient psychiatrist. Patient is instructed to abstain from alcohol and illegal drugs while on prescription medications. In the event of worsening symptoms, patient is instructed to call the crisis hotline, 911, or go to the nearest emergency department for evaluation and treatment.  Signed: Aldean Baker, NP 10/24/2019, 9:47 AM   Patient seen, Suicide Assessment Completed.   Disposition Plan Reviewed

## 2019-10-24 NOTE — Discharge Instructions (Signed)
Bupropion extended-release tablets (Depression/Mood Disorders) What is this medicine? BUPROPION (byoo PROE pee on) is used to treat depression. This medicine may be used for other purposes; ask your health care provider or pharmacist if you have questions. COMMON BRAND NAME(S): Aplenzin, Budeprion XL, Forfivo XL, Wellbutrin XL  How should I use this medicine? Take this medicine by mouth with a glass of water. Follow the directions on the prescription label. You can take it with or without food. If it upsets your stomach, take it with food. Do not crush, chew, or cut these tablets. This medicine is taken once daily at the same time each day. Do not take your medicine more often than directed. Do not stop taking this medicine suddenly except upon the advice of your doctor. Stopping this medicine too quickly may cause serious  Overdosage: If you think you have taken too much of this medicine contact a poison control center or emergency room at once. NOTE: This medicine is only for you. Do not share this medicine with others. What if I miss a dose? If you miss a dose, skip the missed dose and take your next tablet at the regular time. Do not take double or extra doses. What may interact with this medicine? Do not take this medicine with any of the following medications:  linezolid  MAOIs like Azilect, Carbex, Eldepryl, Marplan, Nardil, and Parnate  methylene blue (injected into a vein)  other medicines that contain bupropion like Zyban This medicine may also interact with the following medications:  alcohol  certain medicines for anxiety or sleep  certain medicines for blood pressure like metoprolol, propranolol  certain medicines for depression or psychotic disturbances  certain medicines for HIV or AIDS like efavirenz, lopinavir, nelfinavir, ritonavir  certain medicines for irregular heart beat like propafenone, flecainide  certain medicines for Parkinson's disease like amantadine,  levodopa  certain medicines for seizures like carbamazepine, phenytoin, phenobarbital  cimetidine  clopidogrel  cyclophosphamide  digoxin  furazolidone  isoniazid  nicotine  orphenadrine  procarbazine  steroid medicines like prednisone or cortisone  stimulant medicines for attention disorders, weight loss, or to stay awake  tamoxifen  theophylline  thiotepa  ticlopidine  tramadol  warfarin This list may not describe all possible interactions. Give your health care provider a list of all the medicines, herbs, non-prescription drugs, or dietary supplements you use. Also tell them if you smoke, drink alcohol, or use illegal drugs. Some items may interact with your medicine. What should I watch for while using this medicine? Tell your doctor if your symptoms do not get better or if they get worse. Visit your doctor or healthcare provider for regular checks on your progress. Because it may take several weeks to see the full effects of this medicine, it is important to continue your treatment as prescribed by your doctor. This medicine may cause serious skin reactions. They can happen weeks to months after starting the medicine. Contact your healthcare provider right away if you notice fevers or flu-like symptoms with a rash. The rash may be red or purple and then turn into blisters or peeling of the skin. Or, you might notice a red rash with swelling of the face, lips or lymph nodes in your neck or under your arms. Patients and their families should watch out for new or worsening thoughts of suicide or depression. Also watch out for sudden changes in feelings such as feeling anxious, agitated, panicky, irritable, hostile, aggressive, impulsive, severely restless, overly excited and hyperactive, or not  being able to sleep. If this happens, especially at the beginning of treatment or after a change in dose, call your healthcare provider. Avoid alcoholic drinks while taking this  medicine. Drinking large amounts of alcoholic beverages, using sleeping or anxiety medicines, or quickly stopping the use of these agents while taking this medicine may increase your risk for a seizure. Do not drive or use heavy machinery until you know how this medicine affects you. This medicine can impair your ability to perform these tasks. Do not take this medicine close to bedtime. It may prevent you from sleeping. Your mouth may get dry. Chewing sugarless gum or sucking hard candy, and drinking plenty of water may help. Contact your doctor if the problem does not go away or is severe. The tablet shell for some brands of this medicine does not dissolve. This is normal. The tablet shell may appear whole in the stool. This is not a cause for concern. What side effects may I notice from receiving this medicine? Side effects that you should report to your doctor or health care professional as soon as possible:  allergic reactions like skin rash, itching or hives, swelling of the face, lips, or tongue  breathing problems  changes in vision  confusion  elevated mood, decreased need for sleep, racing thoughts, impulsive behavior  fast or irregular heartbeat  hallucinations, loss of contact with reality  increased blood pressure  rash, fever, and swollen lymph nodes  redness, blistering, peeling or loosening of the skin, including inside the mouth  seizures  suicidal thoughts or other mood changes  unusually weak or tired  vomiting Side effects that usually do not require medical attention (report to your doctor or health care professional if they continue or are bothersome):  constipation  headache  loss of appetite  nausea  tremors  weight loss This list may not describe all possible side effects. Call your doctor for medical advice about side effects. You may report side effects to FDA at 1-800-FDA-1088. Where should I keep my medicine? Keep out of the reach of  children. Store at room temperature between 15 and 30 degrees C (59 and 86 degrees F). Throw away any unused medicine after the expiration date. NOTE: This sheet is a summary. It may not cover all possible information. If you have questions about this medicine, talk to your doctor, pharmacist, or health care provider.  2020 Elsevier/Gold Standard (2018-08-07 13:45:31)

## 2019-10-24 NOTE — Progress Notes (Signed)
  [facility] Adult Case Management Discharge Plan :  Will you be returning to the same living situation after discharge:  Yes,  with girlfriend and children At discharge, do you have transportation home?: Yes,  patient states he can make these arrangements Do you have the ability to pay for your medications: No.  No income, no insurance  Release of information consent forms completed and emailed to Medical Records, then turned in to Medical Records by CSW.   Patient to Follow up at: Follow-up Information    [facility]. Go on 11/19/2019.   Why: You are scheduled for an appointment for medication management on 11/19/19 at 9:00 am.  This appointment will be held in person.  Contact information: [address],  [place], [place] [address]  P: [phone] F: [phone]       [facility] Follow up on 10/28/2019.   Why: Your hospital discharge appointment will be held by phone on 06/02 at 12:00pm. The provider will call you. Contact information:  [address], [place], [place] [address] Phone: [phone] Fax: [phone]          Next level of care provider has access to [facility] Link:no  Safety Planning and Suicide Prevention discussed: No.  Patient refused, so this was done with just him     Has patient been referred to the Quitline?: Patient refused referral  Patient smokes 1-1/2 packs daily, is a [place] resident so cannot be refused to [employer].  He does not have an interest in quitting.  Patient has been referred for addiction treatment: Yes  Comprehensive Clinical Assessment to be done at [facility] to assess for further treatment recommendations.  Lynnell Chad, LCSW 10/24/2019, 9:34 AM

## 2019-10-24 NOTE — [facility] Suicide Risk Assessment (Signed)
[facility] Discharge Suicide Risk Assessment   Principal Problem: MDD (major depressive disorder), recurrent severe, without psychosis (HCC) Discharge Diagnoses: Principal Problem:   MDD (major depressive disorder), recurrent severe, without psychosis (HCC) Active Problems:   Methamphetamine dependence (HCC)   Total Time spent with patient: 30 minutes  Musculoskeletal: Strength & Muscle Tone: within normal limits Gait & Station: normal Patient leans: N/A  Psychiatric Specialty Exam: Review of Systems  Improving odynophagia, neck laceration/wound is healing well, not painful, no bleeding or exudate .   Blood pressure 136/84, pulse 76, temperature (!) 97.3 F (36.3 C), temperature source Oral, resp. rate 18, height 6\' 1"  (1.854 m), weight 95.7 kg, SpO2 100 %.Body mass index is 27.84 kg/m.  General Appearance: Well Groomed  Eye Contact::  Good  Speech:  Normal Rate409  Volume:  Normal  Mood:  Euthymic  Affect:  Appropriate and Full Range  Thought Process:  Linear and Descriptions of Associations: Intact  Orientation:  Full (Time, Place, and Person)  Thought Content:  no hallucinations, no delusions, not internally preoccupied   Suicidal Thoughts:  No denies suicidal or self injurious ideations, denies homicidal or violent ideations  Homicidal Thoughts:  No  Memory:  recent and remote grossly intact   Judgement:  Other:  improving  Insight:  improving  Psychomotor Activity:  Normal  Concentration:  Good  Recall:  Good  Fund of Knowledge:Good  Language: Good  Akathisia:  Negative  Handed:  Right  AIMS (if indicated):     Assets:  Communication Skills Desire for Improvement Resilience  Sleep:  Number of Hours: 6.75  Cognition: WNL  ADL's:  Intact   Mental Status Per Nursing Assessment::   On Admission:  NA  Demographic Factors:  33, single, lives with GF and daughter, employed as [phone]   Loss Factors: Substance abuse history   Historical Factors: No prior psychiatric  admissions, no prior suicide attempts   Risk Reduction Factors:   Responsible for children under 43 years of age, Sense of responsibility to family, Employed, Living with another person, especially a relative, Positive social support and Positive coping skills or problem solving skills  Continued Clinical Symptoms:  Alert, attentive, well related, calm, pleasant on approach, mood euthymic, affect appropriate and full in range, no thought disorder, no SI or HI, no self injurious ideations ,  no psychotic symptoms, future oriented . Denies medication side effects. Side effects reviewed, including risk of seizure on Wellbutrin. Behavior on unit calm and in good control. With his express consent I spoke with his GF who corroborates that patient is currently stable and is in agreement with discharge. She states that he tends to be high functioning and stable when abstinent/sober.  He reports that he plans to attend AA meetings regularly and resume contact with his sponsor .  Cognitive Features That Contribute To Risk:  No gross cognitive deficits noted upon discharge. Is alert , attentive, and oriented x 3     Suicide Risk:  Mild:  Suicidal ideation of limited frequency, intensity, duration, and specificity.  There are no identifiable plans, no associated intent, mild dysphoria and related symptoms, good self-control (both objective and subjective assessment), few other risk factors, and identifiable protective factors, including available and accessible social support.  Follow-up Information    [facility]. Go on 11/19/2019.   Why: You are scheduled for an appointment for medication management on 11/19/19 at 9:00 am.  This appointment will be held in person.  Contact information: [address],  [place], [place] [address]  P: [phone] F: [phone]       [facility] Follow up on 10/28/2019.   Why: Your hospital discharge appointment will be held by phone on  06/02 at 12:00pm. The provider will call you. Contact information:  [address], [place], [place] [address] Phone: [phone] Fax: [phone]          Plan Of Care/Follow-up recommendations:  Activity:  as tolerated Diet:  Regular Tests:  NA Other:  See below  Patient is expressing readiness for discharge and is leaving unit in good spirits  Plans to return home Plans to follow up as above - as noted - expresses motivation in sobriety/abstinence and states plans to attend AA regularly. Plans to go to PCP or Urgent Care for suture removal   Jenne Campus, MD 10/24/2019, 9:52 AM

## 2021-08-02 IMAGING — DX DG CHEST 1V PORT
1 series · 1 of 1 positions shown · non-contrast
Comparison: None.

CLINICAL DATA: Hypoxia.  Neck injury

EXAM:
PORTABLE CHEST 1 VIEW

[chest ap]
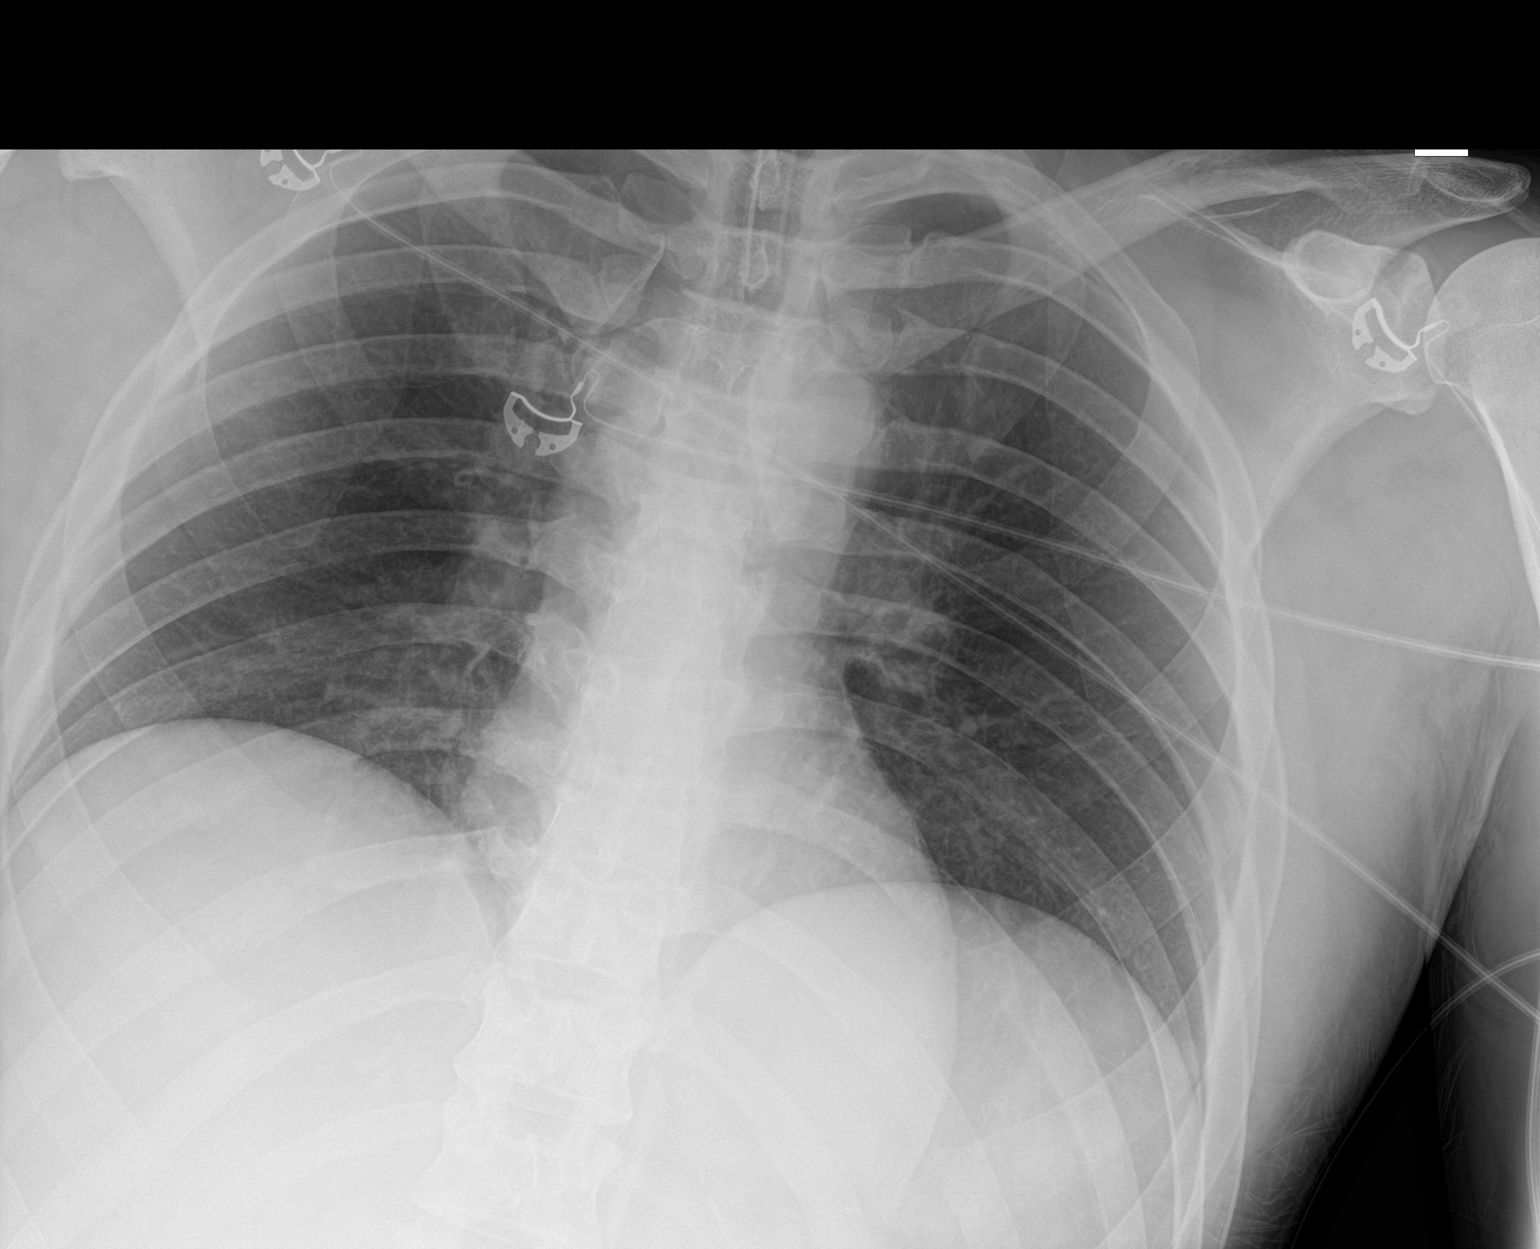

[1 of 1 positions shown; findings below may reference images not displayed]

FINDINGS: Endotracheal tube tip is 5.2 cm above carina. No pneumothorax. The
lungs are clear. Heart size and pulmonary vascularity are normal. No
adenopathy. No bone lesions.
IMPRESSION: Endotracheal tube as described without pneumothorax. Lungs clear.
Cardiac silhouette normal limits.

## 2021-08-03 IMAGING — DX DG CHEST 1V PORT
1 series · 1 of 1 positions shown · non-contrast
Comparison: 10/16/2019

CLINICAL DATA: Respiratory failure

EXAM:
PORTABLE CHEST 1 VIEW

[chest]
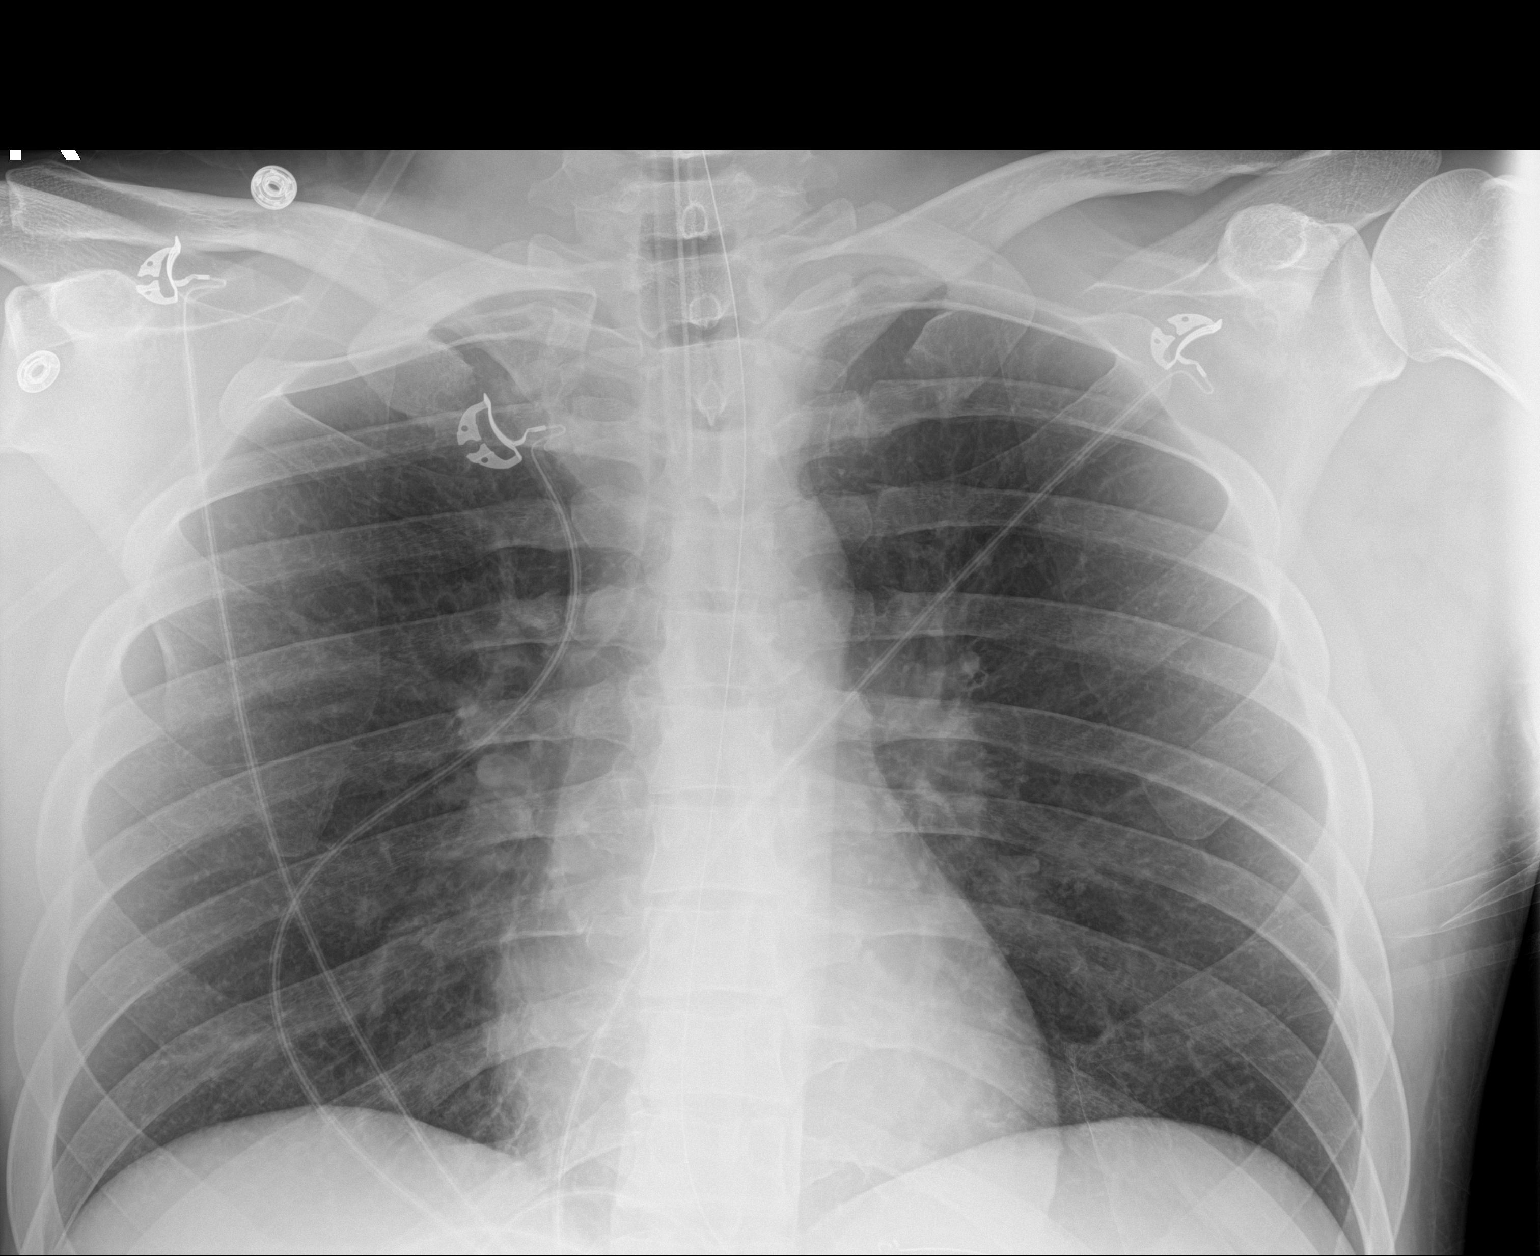

[1 of 1 positions shown; findings below may reference images not displayed]

FINDINGS: Cardiac shadow is within normal limits. Endotracheal tube and
gastric catheter are noted. Lungs are clear bilaterally. No bony
abnormality is noted.
IMPRESSION: Tubes and lines as described above.

No acute abnormality noted.

## 2023-08-27 DEATH — deceased
